# Patient Record
Sex: Female | Born: 1980 | Race: White | Hispanic: No | Marital: Married | Smoking: Never smoker
Health system: Southern US, Community
[De-identification: ages and names within clinical notes are randomized; demographics above are authoritative.]

## PROBLEM LIST (undated history)

## (undated) DIAGNOSIS — F32A Depression, unspecified: Secondary | ICD-10-CM

## (undated) DIAGNOSIS — F419 Anxiety disorder, unspecified: Secondary | ICD-10-CM

## (undated) DIAGNOSIS — F329 Major depressive disorder, single episode, unspecified: Secondary | ICD-10-CM

## (undated) DIAGNOSIS — B009 Herpesviral infection, unspecified: Secondary | ICD-10-CM

## (undated) DIAGNOSIS — R7989 Other specified abnormal findings of blood chemistry: Secondary | ICD-10-CM

## (undated) DIAGNOSIS — E229 Hyperfunction of pituitary gland, unspecified: Secondary | ICD-10-CM

## (undated) DIAGNOSIS — F429 Obsessive-compulsive disorder, unspecified: Secondary | ICD-10-CM

## (undated) HISTORY — DX: Other specified abnormal findings of blood chemistry: R79.89

## (undated) HISTORY — PX: WISDOM TOOTH EXTRACTION: SHX21

## (undated) HISTORY — DX: Hyperfunction of pituitary gland, unspecified: E22.9

## (undated) HISTORY — DX: Herpesviral infection, unspecified: B00.9

---

## 2002-10-27 ENCOUNTER — Encounter: Payer: Self-pay | Admitting: Emergency Medicine

## 2002-10-27 ENCOUNTER — Emergency Department (HOSPITAL_COMMUNITY): Admission: EM | Admit: 2002-10-27 | Discharge: 2002-10-27 | Payer: Self-pay | Admitting: Emergency Medicine

## 2003-03-25 ENCOUNTER — Emergency Department (HOSPITAL_COMMUNITY): Admission: EM | Admit: 2003-03-25 | Discharge: 2003-03-25 | Payer: Self-pay | Admitting: Emergency Medicine

## 2007-01-13 ENCOUNTER — Other Ambulatory Visit: Admission: RE | Admit: 2007-01-13 | Discharge: 2007-01-13 | Payer: Self-pay | Admitting: Gynecology

## 2007-10-01 ENCOUNTER — Ambulatory Visit: Payer: Self-pay | Admitting: Gynecology

## 2008-01-21 ENCOUNTER — Encounter: Payer: Self-pay | Admitting: Gynecology

## 2008-01-21 ENCOUNTER — Other Ambulatory Visit: Admission: RE | Admit: 2008-01-21 | Discharge: 2008-01-21 | Payer: Self-pay | Admitting: Gynecology

## 2008-01-21 ENCOUNTER — Ambulatory Visit: Payer: Self-pay | Admitting: Gynecology

## 2008-03-31 ENCOUNTER — Ambulatory Visit: Payer: Self-pay | Admitting: Gynecology

## 2008-07-14 ENCOUNTER — Ambulatory Visit: Payer: Self-pay | Admitting: Gynecology

## 2008-12-22 ENCOUNTER — Ambulatory Visit: Payer: Self-pay | Admitting: Gynecology

## 2009-03-11 ENCOUNTER — Ambulatory Visit: Payer: Self-pay | Admitting: Gynecology

## 2009-03-11 ENCOUNTER — Other Ambulatory Visit: Admission: RE | Admit: 2009-03-11 | Discharge: 2009-03-11 | Payer: Self-pay | Admitting: Gynecology

## 2009-05-11 ENCOUNTER — Ambulatory Visit: Payer: Self-pay | Admitting: Gynecology

## 2009-07-04 ENCOUNTER — Ambulatory Visit: Payer: Self-pay | Admitting: Gynecology

## 2010-01-08 HISTORY — PX: OTHER SURGICAL HISTORY: SHX169

## 2010-02-20 ENCOUNTER — Ambulatory Visit (INDEPENDENT_AMBULATORY_CARE_PROVIDER_SITE_OTHER): Payer: BC Managed Care – PPO | Admitting: Gynecology

## 2010-02-20 DIAGNOSIS — B3731 Acute candidiasis of vulva and vagina: Secondary | ICD-10-CM

## 2010-02-20 DIAGNOSIS — N9089 Other specified noninflammatory disorders of vulva and perineum: Secondary | ICD-10-CM

## 2010-02-20 DIAGNOSIS — Z113 Encounter for screening for infections with a predominantly sexual mode of transmission: Secondary | ICD-10-CM

## 2010-02-20 DIAGNOSIS — B373 Candidiasis of vulva and vagina: Secondary | ICD-10-CM

## 2010-02-20 DIAGNOSIS — N898 Other specified noninflammatory disorders of vagina: Secondary | ICD-10-CM

## 2010-03-13 ENCOUNTER — Ambulatory Visit (INDEPENDENT_AMBULATORY_CARE_PROVIDER_SITE_OTHER): Payer: BC Managed Care – PPO | Admitting: Gynecology

## 2010-03-13 DIAGNOSIS — A6004 Herpesviral vulvovaginitis: Secondary | ICD-10-CM

## 2010-05-03 ENCOUNTER — Other Ambulatory Visit (HOSPITAL_COMMUNITY)
Admission: RE | Admit: 2010-05-03 | Discharge: 2010-05-03 | Disposition: A | Discharge status: Home / Self Care | Payer: BC Managed Care – PPO | Source: Ambulatory Visit | Attending: Gynecology | Admitting: Gynecology

## 2010-05-03 ENCOUNTER — Encounter (INDEPENDENT_AMBULATORY_CARE_PROVIDER_SITE_OTHER): Payer: BC Managed Care – PPO | Admitting: Gynecology

## 2010-05-03 ENCOUNTER — Other Ambulatory Visit: Payer: Self-pay | Admitting: Gynecology

## 2010-05-03 DIAGNOSIS — Z124 Encounter for screening for malignant neoplasm of cervix: Secondary | ICD-10-CM | POA: Insufficient documentation

## 2010-05-03 DIAGNOSIS — Z01419 Encounter for gynecological examination (general) (routine) without abnormal findings: Secondary | ICD-10-CM

## 2010-05-03 DIAGNOSIS — R823 Hemoglobinuria: Secondary | ICD-10-CM

## 2010-05-03 DIAGNOSIS — Z13220 Encounter for screening for lipoid disorders: Secondary | ICD-10-CM

## 2010-05-27 ENCOUNTER — Emergency Department (HOSPITAL_COMMUNITY): Payer: BC Managed Care – PPO

## 2010-05-27 ENCOUNTER — Inpatient Hospital Stay (HOSPITAL_COMMUNITY)
Admission: EM | Admit: 2010-05-27 | Discharge: 2010-05-30 | DRG: 559 | Disposition: A | Discharge status: Home / Self Care | Payer: BC Managed Care – PPO | Attending: General Surgery | Admitting: General Surgery

## 2010-05-27 DIAGNOSIS — S52209A Unspecified fracture of shaft of unspecified ulna, initial encounter for closed fracture: Principal | ICD-10-CM | POA: Diagnosis present

## 2010-05-27 DIAGNOSIS — S36113A Laceration of liver, unspecified degree, initial encounter: Secondary | ICD-10-CM | POA: Diagnosis present

## 2010-05-27 LAB — DIFFERENTIAL
Basophils Relative: 0 % (ref 0–1)
Eosinophils Absolute: 0.1 10*3/uL (ref 0.0–0.7)
Monocytes Absolute: 0.6 10*3/uL (ref 0.1–1.0)
Monocytes Relative: 4 % (ref 3–12)
Neutro Abs: 10.7 10*3/uL — ABNORMAL HIGH (ref 1.7–7.7)

## 2010-05-27 LAB — COMPREHENSIVE METABOLIC PANEL
ALT: 318 U/L — ABNORMAL HIGH (ref 0–35)
AST: 315 U/L — ABNORMAL HIGH (ref 0–37)
Albumin: 3.5 g/dL (ref 3.5–5.2)
Alkaline Phosphatase: 42 U/L (ref 39–117)
GFR calc Af Amer: >60 mL/min (ref >60)
Glucose, Bld: 122 mg/dL — ABNORMAL HIGH (ref 70–99)
Potassium: 3.7 mEq/L (ref 3.5–5.1)
Sodium: 136 mEq/L (ref 135–145)
Total Protein: 6.3 g/dL (ref 6.0–8.3)

## 2010-05-27 LAB — CBC
Hemoglobin: 13.1 g/dL (ref 12.0–15.0)
MCH: 31.9 pg (ref 26.0–34.0)
MCH: 31.3 pg (ref 26.0–34.0)
MCHC: 35.2 g/dL (ref 30.0–36.0)
MCHC: 34.3 g/dL (ref 30.0–36.0)
RDW: 11.8 % (ref 11.5–15.5)

## 2010-05-27 LAB — URINE MICROSCOPIC-ADD ON

## 2010-05-27 LAB — URINALYSIS, ROUTINE W REFLEX MICROSCOPIC
Glucose, UA: NEGATIVE mg/dL
Ketones, ur: NEGATIVE mg/dL
pH: 6.5 (ref 5.0–8.0)

## 2010-05-27 LAB — ABO/RH: ABO/RH(D): A POS

## 2010-05-27 LAB — TYPE AND SCREEN: Antibody Screen: NEGATIVE

## 2010-05-27 LAB — POCT PREGNANCY, URINE: Preg Test, Ur: NEGATIVE

## 2010-05-27 LAB — MRSA PCR SCREENING: MRSA by PCR: NEGATIVE

## 2010-05-27 MED ORDER — IOHEXOL 300 MG/ML  SOLN: 100.0000 mL | Freq: Once | INTRAMUSCULAR | Status: AC | PRN

## 2010-05-28 ENCOUNTER — Inpatient Hospital Stay (HOSPITAL_COMMUNITY): Payer: BC Managed Care – PPO

## 2010-05-28 LAB — CBC
MCHC: 34.5 g/dL (ref 30.0–36.0)
MCV: 91.4 fL (ref 78.0–100.0)
Platelets: 168 10*3/uL (ref 150–400)
RDW: 11.8 % (ref 11.5–15.5)
WBC: 8.6 10*3/uL (ref 4.0–10.5)

## 2010-05-28 LAB — PROTIME-INR: INR: 1.05 (ref 0.00–1.49)

## 2010-05-28 LAB — BASIC METABOLIC PANEL
BUN: 7 mg/dL (ref 6–23)
CO2: 25 mEq/L (ref 19–32)
Calcium: 8.5 mg/dL (ref 8.4–10.5)
Creatinine, Ser: <0.47 mg/dL (ref 0.4–1.2)

## 2010-05-29 LAB — DIFFERENTIAL
Basophils Absolute: 0 10*3/uL (ref 0.0–0.1)
Basophils Relative: 0 % (ref 0–1)
Eosinophils Absolute: 0.1 10*3/uL (ref 0.0–0.7)
Monocytes Absolute: 0.9 10*3/uL (ref 0.1–1.0)
Monocytes Relative: 10 % (ref 3–12)
Neutro Abs: 6.3 10*3/uL (ref 1.7–7.7)

## 2010-05-29 LAB — CBC
Hemoglobin: 12.5 g/dL (ref 12.0–15.0)
MCH: 32.2 pg (ref 26.0–34.0)
MCHC: 35 g/dL (ref 30.0–36.0)
Platelets: 163 10*3/uL (ref 150–400)
RDW: 11.7 % (ref 11.5–15.5)

## 2010-05-29 LAB — BASIC METABOLIC PANEL
CO2: 26 mEq/L (ref 19–32)
Calcium: 8.6 mg/dL (ref 8.4–10.5)
Creatinine, Ser: <0.47 mg/dL (ref 0.4–1.2)
Sodium: 136 mEq/L (ref 135–145)

## 2010-05-30 LAB — CBC
Hemoglobin: 13.1 g/dL (ref 12.0–15.0)
MCH: 31.1 pg (ref 26.0–34.0)
MCV: 92.2 fL (ref 78.0–100.0)
Platelets: 197 10*3/uL (ref 150–400)
RBC: 4.21 MIL/uL (ref 3.87–5.11)
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
## 2011-03-21 ENCOUNTER — Ambulatory Visit (INDEPENDENT_AMBULATORY_CARE_PROVIDER_SITE_OTHER): Payer: BC Managed Care – PPO | Admitting: Family Medicine

## 2011-03-21 VITALS — BP 100/62 | HR 73 | Temp 98.3°F | Resp 16 | Ht 69.0 in | Wt 134.6 lb

## 2011-03-21 DIAGNOSIS — J069 Acute upper respiratory infection, unspecified: Secondary | ICD-10-CM

## 2011-03-21 DIAGNOSIS — H9209 Otalgia, unspecified ear: Secondary | ICD-10-CM
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
## 2011-04-30 ENCOUNTER — Other Ambulatory Visit: Payer: Self-pay | Admitting: Gynecology

## 2011-05-11 ENCOUNTER — Encounter: Payer: Self-pay | Admitting: Gynecology

## 2011-05-11 DIAGNOSIS — A64 Unspecified sexually transmitted disease: Secondary | ICD-10-CM | POA: Insufficient documentation

## 2011-05-11 DIAGNOSIS — B009 Herpesviral infection, unspecified: Secondary | ICD-10-CM | POA: Insufficient documentation

## 2011-05-15 ENCOUNTER — Encounter: Payer: BC Managed Care – PPO | Admitting: Gynecology

## 2011-05-16 ENCOUNTER — Encounter: Payer: Self-pay | Admitting: Gynecology

## 2011-05-16 ENCOUNTER — Ambulatory Visit (INDEPENDENT_AMBULATORY_CARE_PROVIDER_SITE_OTHER): Payer: BC Managed Care – PPO | Admitting: Gynecology

## 2011-05-16 VITALS — BP 102/60 | Ht 69.0 in | Wt 139.0 lb

## 2011-05-16 DIAGNOSIS — Z01419 Encounter for gynecological examination (general) (routine) without abnormal findings: Secondary | ICD-10-CM

## 2011-05-16 DIAGNOSIS — N643 Galactorrhea not associated with childbirth: Secondary | ICD-10-CM

## 2011-05-16 DIAGNOSIS — Z131 Encounter for screening for diabetes mellitus: Secondary | ICD-10-CM

## 2011-05-16 DIAGNOSIS — E229 Hyperfunction of pituitary gland, unspecified: Secondary | ICD-10-CM

## 2011-05-16 DIAGNOSIS — L729 Follicular cyst of the skin and subcutaneous tissue, unspecified: Secondary | ICD-10-CM

## 2011-05-16 DIAGNOSIS — E221 Hyperprolactinemia: Secondary | ICD-10-CM

## 2011-05-16 DIAGNOSIS — L723 Sebaceous cyst: Secondary | ICD-10-CM

## 2011-05-16 LAB — CBC WITH DIFFERENTIAL/PLATELET
Basophils Absolute: 0 10*3/uL (ref 0.0–0.1)
Basophils Relative: 0 % (ref 0–1)
Eosinophils Relative: 1 % (ref 0–5)
HCT: 40.9 % (ref 36.0–46.0)
MCHC: 34 g/dL (ref 30.0–36.0)
MCV: 94 fL (ref 78.0–100.0)
Monocytes Absolute: 0.4 10*3/uL (ref 0.1–1.0)
RDW: 11.6 % (ref 11.5–15.5)

## 2011-05-16 MED ORDER — NORETHINDRONE ACET-ETHINYL EST 1-20 MG-MCG PO TABS: 1.0000 | ORAL_TABLET | Freq: Every day | ORAL | Status: DC

## 2011-05-16 NOTE — Patient Instructions (Signed)
Follow up in one year for her annual gynecologic exam. 

## 2011-05-16 NOTE — Progress Notes (Signed)
Melanie Khan 1981/01/05 [id number]        31 y.o.  for annual exam.  Several issues below.  Past medical history,surgical history, medications, allergies, family history and social history were all reviewed and documented in the EPIC chart. ROS:  Was performed and pertinent positives and negatives are included in the history.  Exam: Melanie Khan chaperone present Filed Vitals:   05/16/11 1426  BP: 102/60   General appearance  Normal Skin grossly normal Head/Neck normal with no cervical or supraclavicular adenopathy thyroid normal Lungs  clear Cardiac RR, without RMG Abdominal  soft, nontender, without masses, organomegaly or hernia Breasts  examined lying and sitting without masses, retractions or axillary adenopathy.  Milky galactorrhea left nipple none on the right.  Small mobile cutaneous nodule mid anterior axillary line on the left. Pelvic  Ext/BUS/vagina  normal   Cervix  normal   Uterus  Is inverted, normal size, shape and contour, midline and mobile nontender   Adnexa  Without masses or tenderness    Anus and perineum  normal      Assessment/Plan:  31 y.o. female for annual exam.    1. Galactorrhea. Patient has a history of mild hyperprolactinemia last value 28 2010. She has intermittent milky galactorrhea bilaterally. I couldn't elicit galactorrhea on the left. We'll recheck prolactin now. Otherwise we'll continue to monitor. 2. Cutaneous nodule left anterior mid axillary line. Has been present since childhood. Has remained unchanged to my exam for several years. We'll continue to monitor as it does not bother her. 3. Pap smear. No Pap smear was done today. Her last Pap smear was 2012. She has no history of abnormal Pap smears with multiple normal records in her chart. I reviewed current screening guidelines we'll plan every 3-5 your Pap smears. 4. Birth control. She is on Loestrin 120 equivalents doing well and I refilled her times a year. The risks of birth control pills to  include stroke heart attack DVT thrombosis was reviewed and accepted. Alternatives to include progesterone only such as Depo-Provera Implanon progesterone only pill as well as IUD were reviewed. 5. STD screening. She does have a history of positive chlamydia 2011. She's had several negative screens since then. I offered rescreening now she declined and did not feel that she is at risk for this. 6. Health maintenance. Baseline CBC and glucose were ordered. She has several normal lipid profiles in her chart the last in 2011 and I did not repeat it today.    Dara Lords MD, 2:54 PM 05/16/2011

## 2011-07-08 ENCOUNTER — Ambulatory Visit (INDEPENDENT_AMBULATORY_CARE_PROVIDER_SITE_OTHER): Payer: BC Managed Care – PPO | Admitting: Internal Medicine

## 2011-07-08 VITALS — BP 97/49 | HR 57 | Temp 98.3°F | Resp 16 | Ht 69.0 in | Wt 137.8 lb

## 2011-07-08 DIAGNOSIS — L089 Local infection of the skin and subcutaneous tissue, unspecified: Secondary | ICD-10-CM

## 2011-07-08 DIAGNOSIS — S51809A Unspecified open wound of unspecified forearm, initial encounter: Secondary | ICD-10-CM

## 2011-07-08 MED ORDER — AMOXICILLIN-POT CLAVULANATE 875-125 MG PO TABS: 1.0000 | ORAL_TABLET | Freq: Two times a day (BID) | ORAL | Status: AC
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
## 2011-07-09 ENCOUNTER — Telehealth: Payer: Self-pay

## 2011-07-09 ENCOUNTER — Ambulatory Visit (INDEPENDENT_AMBULATORY_CARE_PROVIDER_SITE_OTHER): Payer: BC Managed Care – PPO | Admitting: Internal Medicine

## 2011-07-09 VITALS — BP 116/77 | HR 58 | Temp 98.4°F | Resp 18 | Ht 68.0 in | Wt 140.8 lb

## 2011-07-09 DIAGNOSIS — L03113 Cellulitis of right upper limb: Secondary | ICD-10-CM

## 2011-07-09 DIAGNOSIS — IMO0002 Reserved for concepts with insufficient information to code with codable children: Secondary | ICD-10-CM

## 2011-07-09 MED ORDER — DOXYCYCLINE HYCLATE 100 MG PO TABS: 100.0000 mg | ORAL_TABLET | Freq: Two times a day (BID) | ORAL | Status: AC

## 2011-07-09 NOTE — Telephone Encounter (Signed)
PT STATES SHE WAS SEEN FOR A CAT BITE AND EVEN THOUGH SHE WAS PUT ON ANTIBIOTICS HER HAND IS STILL RED AND SWOLLEN, DIDN'T KNOW WHAT TO LOOK FOR AND IF THAT WAS NORMAL AND HOW MANY DAYS DO SHE NEED TO GIVE IT TO KNOW IT'S GETTING BETTER PLEASE CALL [phone]

## 2011-07-09 NOTE — Telephone Encounter (Signed)
Patient advised to return to clinic for recheck. Patient states she would like to give it another 24 hours.  Advised her if worse come in sooner.  Patient states understanding.

## 2011-07-09 NOTE — Telephone Encounter (Signed)
Left message on machine to return our call. 
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
## 2011-07-12 LAB — WOUND CULTURE: Organism ID, Bacteria: NO GROWTH

## 2011-10-18 ENCOUNTER — Ambulatory Visit (INDEPENDENT_AMBULATORY_CARE_PROVIDER_SITE_OTHER): Payer: BC Managed Care – PPO | Admitting: Family Medicine

## 2011-10-18 VITALS — BP 122/74 | HR 58 | Temp 98.0°F | Resp 17 | Ht 69.0 in | Wt 135.0 lb

## 2011-10-18 DIAGNOSIS — R21 Rash and other nonspecific skin eruption: Secondary | ICD-10-CM

## 2011-10-18 MED ORDER — DOXYCYCLINE HYCLATE 100 MG PO TABS: 100.0000 mg | ORAL_TABLET | Freq: Two times a day (BID) | ORAL | Status: DC

## 2011-10-18 MED ORDER — VALACYCLOVIR HCL 500 MG PO TABS: 500.0000 mg | ORAL_TABLET | Freq: Two times a day (BID) | ORAL | Status: DC

## 2011-10-18 NOTE — Patient Instructions (Addendum)
Start on your valtrex- you have some extra which you can take if you have this problem again. If the valtrex does not seem to be clearing up the problem start on the doxycycline (an antibiotic).

## 2011-10-18 NOTE — Progress Notes (Signed)
[facility] [address], [place] [address] [phone]  Date:  10/18/2011   Name:  Melanie Khan   DOB:  1980/02/25   MRN:  [id number]  PCP:  Sheila Oats, MD    Chief Complaint: Rash   History of Present Illness:  Melanie Khan is a 31 y.o. very pleasant female patient who presents with the following:  About 6 years ago she would get some bumps on her lower body- one abscessed and it turned out to be MRSA.  She had an I and D and recovered.      She has her menses currently.  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  It occurred on her labia.  She is SA with one partner who is also positive for HSV so she is not on any suppressive therapy.  No other vaginal or urinary symptoms   Patient Active Problem List  Diagnosis  . HSV-2 infection  . Elevated prolactin level    Past Medical History  Diagnosis Date  . HSV-2 infection   . Elevated prolactin level     Past Surgical History  Procedure Date  . Right arm fracture 2012    History  Substance Use Topics  . Smoking status: Never Smoker   . Smokeless tobacco: Never Used  . Alcohol Use: Yes     once a week    Family History  Problem Relation Age of Onset  . Hypertension Paternal Grandfather   . Stroke Paternal Aunt     No Known Allergies  Medication list has been reviewed and updated.  Current Outpatient Prescriptions on File Prior to Visit  Medication Sig Dispense Refill  . norethindrone-ethinyl estradiol (GILDESS 1/20) 1-20 MG-MCG tablet Take 1 tablet by mouth daily.  21 tablet  11    Review of Systems: As per HPI- otherwise negative.   Physical Examination: Filed Vitals:   10/18/11 1452  BP: 122/74  Pulse: 58  Temp: 98 F (36.7 C)  Resp: 17   Filed Vitals:   10/18/11 1452  Height: 5\' 9"  (1.753 m)  Weight: 135 lb (61.236 kg)   Body mass index is 19.94 kg/(m^2). Ideal Body Weight: Weight in (lb) to have BMI = 25:  168.9   GEN: WDWN, NAD, Non-toxic, A & O x 3, looks well HEENT: Atraumatic, Normocephalic. Neck supple. No masses, No LAD. Ears and Nose: No external deformity. CV: RRR, No M/G/R. No JVD. No thrill. No extra heart sounds. PULM: CTA B, no wheezes, crackles, rhonchi. No retractions. No resp. distress. No accessory muscle use. ABD: S, NT, ND, +BS. No rebound. No HSM.  On her right inner thigh there is a small group of vesicles typical of HSV.  Very small inguinal nodes on the right EXTR: No c/c/e NEURO Normal gait.  PSYCH: Normally interactive. Conversant. Not depressed or anxious appearing.  Calm demeanor.    Assessment and Plan: 1. Rash of groin  doxycycline (VIBRA-TABS) 100 MG tablet, valACYclovir (VALTREX) 500 MG tablet, DISCONTINUED: valACYclovir (VALTREX) 500 MG tablet   Chales Abrahams appears to be having an HSV outbreak.  Will treat with valcyclovir, and gave extra in care of future outbreaks. She is concerned that she could have a bacterial super- infection, so also gave her doxy that she can sue is the valtrex is not helping.  Discussed suppressive therapy and pregnancy precautions of HSV.  She will let us know if not better soon.  Lamar Blinks, MD

## 2011-11-19 ENCOUNTER — Telehealth: Payer: Self-pay | Admitting: Gynecology

## 2011-11-19 DIAGNOSIS — R21 Rash and other nonspecific skin eruption: Secondary | ICD-10-CM

## 2011-11-19 MED ORDER — VALACYCLOVIR HCL 500 MG PO TABS: 500.0000 mg | ORAL_TABLET | Freq: Two times a day (BID) | ORAL | Status: DC

## 2011-11-19 NOTE — Telephone Encounter (Signed)
Valtrex 500 mg twice daily for 3 days at earliest onset of outbreak. #24 with one refill

## 2011-11-19 NOTE — Telephone Encounter (Signed)
Patient said when she was diagnosed with HSV you told her you could call in RX with refills so she could have it on hand as soon as she felt symptoms.  She would like to get RX so she can have on hand when needed. Is also having sx now.

## 2012-01-01 ENCOUNTER — Ambulatory Visit (INDEPENDENT_AMBULATORY_CARE_PROVIDER_SITE_OTHER): Payer: BC Managed Care – PPO | Admitting: Physician Assistant

## 2012-01-01 VITALS — BP 113/68 | HR 60 | Temp 98.3°F | Resp 16 | Ht 69.0 in | Wt 133.0 lb

## 2012-01-01 DIAGNOSIS — J029 Acute pharyngitis, unspecified: Secondary | ICD-10-CM

## 2012-01-01 DIAGNOSIS — J309 Allergic rhinitis, unspecified: Secondary | ICD-10-CM

## 2012-01-01 DIAGNOSIS — R0982 Postnasal drip: Secondary | ICD-10-CM

## 2012-01-01 DIAGNOSIS — R5381 Other malaise: Secondary | ICD-10-CM

## 2012-01-01 LAB — POCT CBC
MCH, POC: 30.4 pg (ref 27–31.2)
MCV: 97.7 fL — AB (ref 80–97)
MID (cbc): 0.4 (ref 0–0.9)
POC LYMPH PERCENT: 36 %L (ref 10–50)
Platelet Count, POC: 260 10*3/uL (ref 142–424)
RDW, POC: 12.4 %
WBC: 6.1 10*3/uL (ref 4.6–10.2)

## 2012-01-01 LAB — COMPREHENSIVE METABOLIC PANEL
Alkaline Phosphatase: 43 U/L (ref 39–117)
Creat: 0.67 mg/dL (ref 0.50–1.10)
Glucose, Bld: 87 mg/dL (ref 70–99)
Sodium: 142 mEq/L (ref 135–145)
Total Bilirubin: 0.4 mg/dL (ref 0.3–1.2)
Total Protein: 6.7 g/dL (ref 6.0–8.3)

## 2012-01-01 MED ORDER — CEFDINIR 300 MG PO CAPS: 300.0000 mg | ORAL_CAPSULE | Freq: Two times a day (BID) | ORAL | Status: DC

## 2012-01-01 MED ORDER — TRAMADOL HCL 50 MG PO TABS: 50.0000 mg | ORAL_TABLET | Freq: Three times a day (TID) | ORAL | Status: DC | PRN

## 2012-01-01 MED ORDER — FLUTICASONE PROPIONATE 50 MCG/ACT NA SUSP: 2.0000 | Freq: Every day | NASAL | Status: DC

## 2012-01-01 NOTE — Progress Notes (Signed)
Patient ID: Melanie Khan MRN: [id number], DOB: July 07, 1980, 31 y.o. Date of Encounter: 01/01/2012, 10:17 AM  Primary Physician: Default, Provider, MD  Chief Complaint: Sore throat  HPI: 31 y.o. year old female with history below presents sore throat for 10 days. Patient states she was initially sick with URI/influenza like symptoms about three weeks prior and treated with OTC medications eventually causing the symptoms to resolve. She did feel better for a few days; however, her sore throat returned initially on the right side, now on the left side. She does have some associated post nasal drip. No nasal congestion, rhinorrhea, cough, headache, otalgia, or sinus pressure. She has remained afebrile since her original illness three weeks prior. Normal appetite. Some waxing and waning fatigue. Has been pushing fluids. No sick contacts. 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   She also states that when she usually develops an upper respiratory infection they usually linger in her sinuses requiring one or two rounds of antibiotics to clear; however, this is unlike her typical presentation as she does not have the sinus pressure. She does feel like with her usual presentation that she has post nasal drip also, but this time she has a pinpoint sore throat on the left side. She is generally healthy. No tobacco use.    Past Medical History  Diagnosis Date  . HSV-2 infection   . Elevated prolactin level      Home Meds: Prior to Admission medications   Medication Sig Start Date End Date Taking? Authorizing Provider  norethindrone-ethinyl estradiol (GILDESS 1/20) 1-20 MG-MCG tablet Take 1 tablet by mouth daily. 05/16/11  Yes Dara Lords, MD  valACYclovir (VALTREX) 500 MG tablet Take 1 tablet (500 mg total) by mouth 2 (two) times daily. Take for 3 days as needed for outbreaks 11/19/11  Yes Dara Lords, MD    Allergies: No Known Allergies  History   Social History  .  Marital Status: Single    Spouse Name: N/A    Number of Children: N/A  . Years of Education: N/A   Occupational History  . Not on file.   Social History Main Topics  . Smoking status: Never Smoker   . Smokeless tobacco: Never Used  . Alcohol Use: Yes     Comment: once a week  . Drug Use: No  . Sexually Active: Yes    Birth Control/ Protection: Pill   Other Topics Concern  . Not on file   Social History Narrative  . No narrative on file     Review of Systems: Constitutional: Positive for fever and fatigue. Negative for chills, night sweats, or weight changes.  HEENT: Positive for sore throat and post nasal drip. Negative for vision changes, hearing loss, congestion, rhinorrhea, epistaxis, or sinus pressure. Cardiovascular: negative for chest pain or palpitations Respiratory: negative for shortness of breath, or cough Abdominal: negative for abdominal pain, nausea, vomiting, or diarrhea Dermatological: negative for rash Neurologic: negative for headache   Physical Exam: Blood pressure 113/68, pulse 60, temperature 98.3 F (36.8 C), resp. rate 16, height 5\' 9"  (1.753 m), weight 133 lb (60.328 kg), last menstrual period 12/11/2011., Body mass index is 19.64 kg/(m^2). General: Well developed, well nourished, in no acute distress. Head: Normocephalic, atraumatic, eyes without discharge, sclera non-icteric, nares are mildly congested. Bilateral auditory canals clear, TM's are without perforation, pearly grey and translucent with reflective cone of light bilaterally. Oral cavity moist, posterior pharynx mildly erythematous and with post nasal  drip. No exudate, swelling, or peritonsillar abscess. Uvula midline.  Neck: Supple. No thyromegaly. Full ROM. No lymphadenopathy. Lymph nodes: less than 2 cm AC bilaterally.  Lungs: Clear bilaterally to auscultation without wheezes, rales, or rhonchi. Breathing is unlabored. Heart: RRR with S1 S2. No murmurs, rubs, or gallops  appreciated. Abdomen: Soft, non-tender, non-distended with normoactive bowel sounds. No hepatosplenomegaly. No rebound/guarding. No obvious abdominal masses. Negative McBurney's. Msk:  Strength and tone normal for age. Extremities/Skin: Warm and dry. No clubbing or cyanosis. No edema. No rashes or suspicious lesions. Neuro: Alert and oriented X 3. Moves all extremities spontaneously. Gait is normal. CNII-XII grossly in tact. Psych:  Responds to questions appropriately with a normal affect.   Labs: Results for orders placed in visit on 01/01/12  POCT CBC      Component Value Range   WBC 6.1  4.6 - 10.2 K/uL   Lymph, poc 2.2  0.6 - 3.4   POC LYMPH PERCENT 36.0  10 - 50 %L   MID (cbc) 0.4  0 - 0.9   POC MID % 5.8  0 - 12 %M   POC Granulocyte 3.6  2 - 6.9   Granulocyte percent 58.2  37 - 80 %G   RBC 4.48  4.04 - 5.48 M/uL   Hemoglobin 13.6  12.2 - 16.2 g/dL   HCT, POC 21.3  08.6 - 47.9 %   MCV 97.7 (*) 80 - 97 fL   MCH, POC 30.4  27 - 31.2 pg   MCHC 31.1 (*) 31.8 - 35.4 g/dL   RDW, POC 57.8     Platelet Count, POC 260  142 - 424 K/uL   MPV 9.5  0 - 99.8 fL  POCT RAPID STREP A (OFFICE)      Component Value Range   Rapid Strep A Screen Negative  Negative   Throat culture, CMP, EBV titer, and CMV titer all pending.  ASSESSMENT AND PLAN:  31 y.o. year old female with pharyngitis, post nasal drip, and fatigue. -Omnicef 300 mg 1 po bid #20 no RF  -Ultram 50 mg 1 po tid prn pain #30 no RF, SED -Flonase 2 sprays each nare daily #1 RF 6 -May call for Atrovent nasal if needed -Likely some underlying allergic rhinitis causing some of her prolonged mucus production -Await labs   Signed, Eula Listen, PA-C 01/01/2012 10:17 AM

## 2012-01-02 LAB — EPSTEIN-BARR VIRUS VCA ANTIBODY PANEL
EBV EA IgG: 8.6 U/mL (ref <9.0)
EBV NA IgG: >600 U/mL — ABNORMAL HIGH (ref <18.0)

## 2012-01-03 LAB — CYTOMEGALOVIRUS ANTIBODY, IGG: Cytomegalovirus Ab-IgG: 0.14 (ref <0.90)

## 2012-01-04 LAB — CULTURE, GROUP A STREP

## 2012-04-13 ENCOUNTER — Other Ambulatory Visit: Payer: Self-pay | Admitting: Gynecology

## 2012-07-13 ENCOUNTER — Other Ambulatory Visit: Payer: Self-pay | Admitting: Gynecology

## 2012-07-14 NOTE — Telephone Encounter (Signed)
Pt will be called to schedule annual exam KW

## 2012-08-18 ENCOUNTER — Other Ambulatory Visit: Payer: Self-pay | Admitting: Gynecology

## 2012-08-21 ENCOUNTER — Telehealth: Payer: Self-pay

## 2012-08-21 NOTE — Telephone Encounter (Signed)
Patient called Melanie Khan because her OCP refill request was denied. Her last CE was 05/16/11 and we have left a message with each refill that past due for yearly exam and to please call to schedule.  She first told Melanie Khan she wanted [place] to call it in and she would make appt later. She said she is busy with wedding and honeymoon plans.  Melanie Khan did get her to schedule CE but it is Oct 7 because that was your first afternoon appointment and she needed afternoon.  Okay to refill her OCP's until then?

## 2012-08-22 ENCOUNTER — Other Ambulatory Visit: Payer: Self-pay | Admitting: Gynecology

## 2012-08-22 MED ORDER — NORETHINDRONE ACET-ETHINYL EST 1-20 MG-MCG PO TABS: ORAL_TABLET | ORAL | Status: DC

## 2012-08-22 NOTE — Telephone Encounter (Signed)
OK through then, BUT tell patient not beyond that date regardless of the reason

## 2012-08-22 NOTE — Telephone Encounter (Signed)
Patient advised. Rx in.

## 2012-09-26 ENCOUNTER — Other Ambulatory Visit: Payer: Self-pay | Admitting: Obstetrics and Gynecology

## 2012-09-26 ENCOUNTER — Other Ambulatory Visit (HOSPITAL_COMMUNITY)
Admission: RE | Admit: 2012-09-26 | Discharge: 2012-09-26 | Disposition: A | Discharge status: Home / Self Care | Payer: BC Managed Care – PPO | Source: Ambulatory Visit | Attending: Obstetrics and Gynecology | Admitting: Obstetrics and Gynecology

## 2012-09-26 DIAGNOSIS — Z1151 Encounter for screening for human papillomavirus (HPV): Secondary | ICD-10-CM | POA: Insufficient documentation

## 2012-09-26 DIAGNOSIS — Z01419 Encounter for gynecological examination (general) (routine) without abnormal findings: Secondary | ICD-10-CM | POA: Insufficient documentation

## 2012-10-11 ENCOUNTER — Other Ambulatory Visit: Payer: Self-pay | Admitting: Gynecology

## 2012-10-14 ENCOUNTER — Encounter: Payer: BC Managed Care – PPO | Admitting: Gynecology

## 2013-09-17 ENCOUNTER — Telehealth: Payer: Self-pay

## 2013-09-17 NOTE — Telephone Encounter (Signed)
Patient has not seen you since 2013 but had a couple of questions.  She said you had told her that her Prolactin was elevated. She is going to try to conceive now and wondered if it was high enough on the last reading (29.6) that it could interfere with conception and possibly be an issue.

## 2013-09-17 NOTE — Telephone Encounter (Signed)
It is difficult to offer any opinion based on interactions over 32 years old. Would recommend office visit and remeasuring her prolactin level before I can make any assessments

## 2013-09-18 NOTE — Telephone Encounter (Signed)
Patient advised.

## 2013-11-18 ENCOUNTER — Other Ambulatory Visit (HOSPITAL_COMMUNITY)
Admission: RE | Admit: 2013-11-18 | Discharge: 2013-11-18 | Disposition: A | Discharge status: Home / Self Care | Payer: BC Managed Care – PPO | Source: Ambulatory Visit | Attending: Obstetrics and Gynecology | Admitting: Obstetrics and Gynecology

## 2013-11-18 ENCOUNTER — Other Ambulatory Visit: Payer: Self-pay | Admitting: Obstetrics and Gynecology

## 2013-11-18 DIAGNOSIS — Z01419 Encounter for gynecological examination (general) (routine) without abnormal findings: Secondary | ICD-10-CM | POA: Insufficient documentation

## 2013-11-19 LAB — CYTOLOGY - PAP

## 2014-02-12 ENCOUNTER — Other Ambulatory Visit (HOSPITAL_COMMUNITY): Payer: Self-pay | Admitting: Obstetrics & Gynecology

## 2014-02-12 DIAGNOSIS — N979 Female infertility, unspecified: Secondary | ICD-10-CM

## 2014-02-19 ENCOUNTER — Ambulatory Visit (HOSPITAL_COMMUNITY)
Admission: RE | Admit: 2014-02-19 | Discharge: 2014-02-19 | Disposition: A | Discharge status: Home / Self Care | Payer: BC Managed Care – PPO | Source: Ambulatory Visit | Attending: Obstetrics & Gynecology | Admitting: Obstetrics & Gynecology

## 2014-02-19 DIAGNOSIS — N979 Female infertility, unspecified: Secondary | ICD-10-CM | POA: Diagnosis not present

## 2014-02-19 MED ORDER — IOHEXOL 300 MG/ML  SOLN
30.0000 mL | Freq: Once | INTRAMUSCULAR | Status: AC | PRN
  Administered 2014-02-19: 30 mL

## 2014-02-23 ENCOUNTER — Other Ambulatory Visit: Payer: Self-pay | Admitting: Internal Medicine

## 2014-02-23 DIAGNOSIS — R7989 Other specified abnormal findings of blood chemistry: Secondary | ICD-10-CM

## 2014-02-23 DIAGNOSIS — E229 Hyperfunction of pituitary gland, unspecified: Principal | ICD-10-CM

## 2014-03-02 ENCOUNTER — Other Ambulatory Visit: Payer: BC Managed Care – PPO

## 2014-03-17 ENCOUNTER — Ambulatory Visit
Admission: RE | Admit: 2014-03-17 | Discharge: 2014-03-17 | Disposition: A | Discharge status: Home / Self Care | Payer: BC Managed Care – PPO | Source: Ambulatory Visit | Attending: Internal Medicine | Admitting: Internal Medicine

## 2014-03-17 DIAGNOSIS — R7989 Other specified abnormal findings of blood chemistry: Secondary | ICD-10-CM

## 2014-03-17 DIAGNOSIS — E229 Hyperfunction of pituitary gland, unspecified: Principal | ICD-10-CM

## 2014-03-17 MED ORDER — GADOBENATE DIMEGLUMINE 529 MG/ML IV SOLN
6.0000 mL | Freq: Once | INTRAVENOUS | Status: AC | PRN
  Administered 2014-03-17: 6 mL via INTRAVENOUS

## 2014-03-18 ENCOUNTER — Other Ambulatory Visit: Payer: BC Managed Care – PPO

## 2014-12-20 LAB — OB RESULTS CONSOLE ABO/RH: RH Type: POSITIVE

## 2014-12-20 LAB — OB RESULTS CONSOLE HEPATITIS B SURFACE ANTIGEN: Hepatitis B Surface Ag: NEGATIVE

## 2014-12-20 LAB — OB RESULTS CONSOLE RPR: RPR: NONREACTIVE

## 2014-12-20 LAB — OB RESULTS CONSOLE HIV ANTIBODY (ROUTINE TESTING): HIV: NONREACTIVE

## 2014-12-20 LAB — OB RESULTS CONSOLE RUBELLA ANTIBODY, IGM: Rubella: IMMUNE

## 2014-12-22 ENCOUNTER — Inpatient Hospital Stay (HOSPITAL_COMMUNITY)
Admission: AD | Admit: 2014-12-22 | Discharge: 2014-12-22 | Disposition: A | Discharge status: Home / Self Care | Payer: BC Managed Care – PPO | Attending: Obstetrics & Gynecology | Admitting: Obstetrics & Gynecology

## 2014-12-22 ENCOUNTER — Telehealth: Payer: Self-pay

## 2014-12-22 NOTE — MAU Note (Signed)
Gaye in admissions called back here to let us know that Melanie Khan did not want to stay.  She did not want to be charged for an ED visit and said she would wait until her appointment on Friday.  She was informed we would be happy to see her but she did not want to stay.

## 2014-12-22 NOTE — Telephone Encounter (Deleted)
Patient calls back stating she has left MAU prior to being seen.  Patient states she can not afford to be charged for an ER visit and was informed in the office that "going to women[facility] was all the same charge."  Patient informed that provider unsure of patient insurance plan and can not provide any insight regarding this.  Patient encouraged to call office, in am, and discuss with insurance specialist as appropriate.  In the meantime patient encouraged to hydrate, rest, and attempt tylenol for cramping.  Patient also reporting some UTI symptoms and informed that she would have to schedule appt in am or come back into the MAU before meds can be prescribed. JE, CNM

## 2014-12-22 NOTE — Telephone Encounter (Addendum)
Patient call returned regarding question of "can I go to the hospital."  Upon making contact, [facility].  Patient states she was having cramping yesterday and was instructed to report to MAU, but did not.  Patient states she had "a heavy workload" today and cramping resumed.  Patient states she felt it necessary to be seen since the cramping was the same as yesterday.  Patient informed that she would be seen by MAU provider.  No other questions or concerns. JE, CNM   Patient calls back stating she has left MAU prior to being seen.  Patient states she can not afford to be charged for an ER visit and was informed in the office that "going to women's was all the same charge."  Patient informed that provider unsure of patient insurance plan and can not provide any insight regarding this.  Patient encouraged to call office, in am, and discuss with insurance specialist as appropriate.  In the meantime patient encouraged to hydrate, rest, and attempt tylenol for cramping.  Patient also reporting some UTI symptoms and informed that she would have to schedule appt in am or come back into the MAU before meds can be prescribed. JE, CNM

## 2014-12-24 LAB — OB RESULTS CONSOLE GC/CHLAMYDIA
CHLAMYDIA, DNA PROBE: NEGATIVE
GC PROBE AMP, GENITAL: NEGATIVE

## 2015-01-09 NOTE — L&D Delivery Note (Signed)
Delivery Note At 10:12 PM a viable female was delivered via Vaginal, Spontaneous Delivery (Presentation: Left Occiput Anterior).  APGAR: 9, 9; weight pending Placenta status: Intact, Spontaneous.  Cord: 3 vessels with the following complications: None.   Anesthesia: Epidural  Episiotomy: Right Mediolateral;Median Lacerations:   Suture Repair: 2.0 3.0 vicryl Est. Blood Loss (mL): 650  Mom to postpartum.  Baby to Couplet care / Skin to Skin.  Myna HidalgoZAN, Lashawndra Lampkins, M 07/08/2015, 10:53 PM

## 2015-03-23 ENCOUNTER — Inpatient Hospital Stay (HOSPITAL_COMMUNITY)
Admission: AD | Admit: 2015-03-23 | Payer: BC Managed Care – PPO | Source: Ambulatory Visit | Admitting: Obstetrics & Gynecology

## 2015-06-21 ENCOUNTER — Institutional Professional Consult (permissible substitution): Payer: BC Managed Care – PPO | Admitting: Pediatrics

## 2015-06-24 ENCOUNTER — Telehealth: Payer: Self-pay | Admitting: Certified Nurse Midwife

## 2015-06-24 NOTE — Telephone Encounter (Signed)
Linus OrnMary Ann Sawyer is a 35 yo, G1P0010 at  [redacted] wks ega per pt report.   She called via the answering service reporting spotting when she wiped. The on call CNM returned her phone call, after two previous attemps to reach her, and sope with her.   She reports +FM, no lof, no contractions and some light spotting/blood tinged mucous when she wipes.   Pt was reassured that blood tinged mucious is not uncommon as she gets closer to delivery.  Pt instructed to call/ get evaluated if she has bleeding like a period or reduced fetal movement.  Pt states she understands and provided her gratitude.  RS, CNM

## 2015-06-28 ENCOUNTER — Ambulatory Visit (INDEPENDENT_AMBULATORY_CARE_PROVIDER_SITE_OTHER): Payer: Self-pay | Admitting: Pediatrics

## 2015-06-28 DIAGNOSIS — Z7681 Expectant parent(s) prebirth pediatrician visit: Secondary | ICD-10-CM

## 2015-06-28 DIAGNOSIS — Z3490 Encounter for supervision of normal pregnancy, unspecified, unspecified trimester: Secondary | ICD-10-CM

## 2015-06-28 NOTE — Progress Notes (Signed)
Prenatal counseling for impending newborn done-- Z76.81  

## 2015-06-28 NOTE — Patient Instructions (Signed)
see at delivery

## 2015-07-08 ENCOUNTER — Inpatient Hospital Stay (HOSPITAL_COMMUNITY): Payer: BC Managed Care – PPO | Admitting: Anesthesiology

## 2015-07-08 ENCOUNTER — Encounter (HOSPITAL_COMMUNITY): Payer: Self-pay | Admitting: *Deleted

## 2015-07-08 ENCOUNTER — Inpatient Hospital Stay (HOSPITAL_COMMUNITY)
Admission: AD | Admit: 2015-07-08 | Discharge: 2015-07-10 | DRG: 774 | Disposition: A | Discharge status: Home / Self Care | Payer: BC Managed Care – PPO | Source: Ambulatory Visit | Attending: Obstetrics & Gynecology | Admitting: Obstetrics & Gynecology

## 2015-07-08 DIAGNOSIS — Z8249 Family history of ischemic heart disease and other diseases of the circulatory system: Secondary | ICD-10-CM

## 2015-07-08 DIAGNOSIS — Z823 Family history of stroke: Secondary | ICD-10-CM

## 2015-07-08 DIAGNOSIS — Z3A39 39 weeks gestation of pregnancy: Secondary | ICD-10-CM | POA: Diagnosis not present

## 2015-07-08 DIAGNOSIS — A6000 Herpesviral infection of urogenital system, unspecified: Secondary | ICD-10-CM | POA: Diagnosis present

## 2015-07-08 DIAGNOSIS — O9832 Other infections with a predominantly sexual mode of transmission complicating childbirth: Secondary | ICD-10-CM | POA: Diagnosis present

## 2015-07-08 DIAGNOSIS — Z3490 Encounter for supervision of normal pregnancy, unspecified, unspecified trimester: Secondary | ICD-10-CM

## 2015-07-08 LAB — CBC
HCT: 37.9 % (ref 36.0–46.0)
HEMOGLOBIN: 13.1 g/dL (ref 12.0–15.0)
MCH: 32.3 pg (ref 26.0–34.0)
MCHC: 34.6 g/dL (ref 30.0–36.0)
MCV: 93.3 fL (ref 78.0–100.0)
Platelets: 194 10*3/uL (ref 150–400)
RBC: 4.06 MIL/uL (ref 3.87–5.11)
RDW: 12.8 % (ref 11.5–15.5)
WBC: 12.2 10*3/uL — AB (ref 4.0–10.5)

## 2015-07-08 LAB — OB RESULTS CONSOLE GBS: STREP GROUP B AG: NEGATIVE

## 2015-07-08 LAB — TYPE AND SCREEN
ABO/RH(D): A POS
ANTIBODY SCREEN: NEGATIVE

## 2015-07-08 LAB — ABO/RH: ABO/RH(D): A POS

## 2015-07-08 MED ORDER — LACTATED RINGERS IV SOLN: INTRAVENOUS | Status: DC

## 2015-07-08 MED ORDER — LACTATED RINGERS IV SOLN: 500.0000 mL | Freq: Once | INTRAVENOUS | Status: DC

## 2015-07-08 MED ORDER — PHENYLEPHRINE 40 MCG/ML (10ML) SYRINGE FOR IV PUSH (FOR BLOOD PRESSURE SUPPORT)
80.0000 ug | PREFILLED_SYRINGE | INTRAVENOUS | Status: DC | PRN
  Filled 2015-07-08: qty 5

## 2015-07-08 MED ORDER — ONDANSETRON HCL 4 MG/2ML IJ SOLN: 4.0000 mg | Freq: Four times a day (QID) | INTRAMUSCULAR | Status: DC | PRN

## 2015-07-08 MED ORDER — OXYTOCIN BOLUS FROM INFUSION
500.0000 mL | INTRAVENOUS | Status: DC
  Administered 2015-07-08: 500 mL via INTRAVENOUS

## 2015-07-08 MED ORDER — PHENYLEPHRINE 40 MCG/ML (10ML) SYRINGE FOR IV PUSH (FOR BLOOD PRESSURE SUPPORT)
80.0000 ug | PREFILLED_SYRINGE | INTRAVENOUS | Status: DC | PRN
  Filled 2015-07-08: qty 5
  Filled 2015-07-08: qty 10

## 2015-07-08 MED ORDER — MISOPROSTOL 200 MCG PO TABS
1000.0000 ug | ORAL_TABLET | Freq: Once | ORAL | Status: AC
  Administered 2015-07-09: 1000 ug via RECTAL

## 2015-07-08 MED ORDER — LACTATED RINGERS IV SOLN: 500.0000 mL | INTRAVENOUS | Status: DC | PRN

## 2015-07-08 MED ORDER — SOD CITRATE-CITRIC ACID 500-334 MG/5ML PO SOLN: 30.0000 mL | ORAL | Status: DC | PRN

## 2015-07-08 MED ORDER — MISOPROSTOL 200 MCG PO TABS
ORAL_TABLET | ORAL | Status: AC
  Filled 2015-07-08: qty 5

## 2015-07-08 MED ORDER — MISOPROSTOL 200 MCG PO TABS
ORAL_TABLET | ORAL | Status: AC
  Administered 2015-07-09: 1000 ug via RECTAL
  Filled 2015-07-08: qty 5

## 2015-07-08 MED ORDER — OXYTOCIN 40 UNITS IN LACTATED RINGERS INFUSION - SIMPLE MED: 2.5000 [IU]/h | INTRAVENOUS | Status: DC

## 2015-07-08 MED ORDER — OXYCODONE-ACETAMINOPHEN 5-325 MG PO TABS: 2.0000 | ORAL_TABLET | ORAL | Status: DC | PRN

## 2015-07-08 MED ORDER — OXYCODONE-ACETAMINOPHEN 5-325 MG PO TABS: 1.0000 | ORAL_TABLET | ORAL | Status: DC | PRN

## 2015-07-08 MED ORDER — FLEET ENEMA 7-19 GM/118ML RE ENEM: 1.0000 | ENEMA | RECTAL | Status: DC | PRN

## 2015-07-08 MED ORDER — DIPHENHYDRAMINE HCL 50 MG/ML IJ SOLN: 12.5000 mg | INTRAMUSCULAR | Status: DC | PRN

## 2015-07-08 MED ORDER — LIDOCAINE HCL (PF) 1 % IJ SOLN
30.0000 mL | INTRAMUSCULAR | Status: DC | PRN
  Filled 2015-07-08: qty 30

## 2015-07-08 MED ORDER — OXYTOCIN BOLUS FROM INFUSION: 500.0000 mL | INTRAVENOUS | Status: DC

## 2015-07-08 MED ORDER — EPHEDRINE 5 MG/ML INJ
10.0000 mg | INTRAVENOUS | Status: DC | PRN
  Filled 2015-07-08: qty 2

## 2015-07-08 MED ORDER — LIDOCAINE HCL (PF) 1 % IJ SOLN: 30.0000 mL | INTRAMUSCULAR | Status: DC | PRN

## 2015-07-08 MED ORDER — FENTANYL 2.5 MCG/ML BUPIVACAINE 1/10 % EPIDURAL INFUSION (WH - ANES)
14.0000 mL/h | INTRAMUSCULAR | Status: DC | PRN
Start: 2015-07-08 — End: 2015-07-09
  Administered 2015-07-08: 14 mL/h via EPIDURAL
  Filled 2015-07-08: qty 125

## 2015-07-08 MED ORDER — ACETAMINOPHEN 325 MG PO TABS: 650.0000 mg | ORAL_TABLET | ORAL | Status: DC | PRN

## 2015-07-08 MED ORDER — OXYTOCIN 40 UNITS IN LACTATED RINGERS INFUSION - SIMPLE MED
2.5000 [IU]/h | INTRAVENOUS | Status: DC
  Administered 2015-07-09: 2.5 [IU]/h via INTRAVENOUS
  Filled 2015-07-08 (×2): qty 1000

## 2015-07-08 MED ORDER — LIDOCAINE HCL (PF) 1 % IJ SOLN
INTRAMUSCULAR | Status: DC | PRN
  Administered 2015-07-08 (×2): 6 mL

## 2015-07-08 NOTE — Progress Notes (Signed)
Patient reports Herpes, and states not having outbreak. Patient requested Ozan examine. Called Ozan and was advised when she finishes in the office she'll come by and do the exam and AROM patient. Ozan also advised when she checked patient yesterday, there was no outbreak.

## 2015-07-08 NOTE — H&P (Signed)
Melanie Khan is a 35 y.o. female, G2P0010 at 39w6 weeks, presenting for painful contractions since last night.  No LOF, No VB, +FM  ROS: no headache, no blurry vision, no RUQ pain.  Pregnancy c/b: 1)HSV2- on valtrex suppression, asymptomatic.   OB History    Gravida Para Term Preterm AB TAB SAB Ectopic Multiple Living   2    1          Past Medical History  Diagnosis Date  . HSV-2 infection   . Elevated prolactin level [facility])    Past Surgical History  Procedure Laterality Date  . Right arm fracture  2012  . Wisdom tooth extraction     Family History: family history includes Hypertension in her paternal grandfather; Stroke in her paternal aunt. Social History:  reports that she has never smoked. She has never used smokeless tobacco. She reports that she drinks alcohol. She reports that she does not use illicit drugs.   Prenatal Transfer Tool  Maternal Diabetes: No Genetic Screening: Normal Maternal Ultrasounds/Referrals: Normal Fetal Ultrasounds or other Referrals:  None Maternal Substance Abuse:  No Significant Maternal Medications:  None Significant Maternal Lab Results: Lab values include: Group B Strep negative  TDAP 05/02/15   No Known Allergies   O: BP 135/80 mmHg  Pulse 76  Temp(Src) 98.4 F (36.9 C) (Oral)  Resp 20  Ht 5\' 9"  (1.753 m)  Wt 86.183 kg (190 lb)  BMI 28.05 kg/m2 Gen: NAD CV: RR Lungs: normal respiratory effort Abd gravid, NT Pelvic: no external or internal lesions noted Ext: no edema, no calf tenderness  FHR: Category I UCs:  q324min SVE: 4-5/70/-2, AROM clear  Prenatal labs: ABO, Rh:  A positive Antibody:  negative Rubella:  immune RPR:   NR HBsAg:   negative HIV:   negative GBS:  negative Pap:  11/2013-negative GC:  negaitve Chlamydia:  negative Genetic screenings:  Tetra negative Glucola:  Elevated 1hr (163), normal 3hr Hgb 12.5 (05/16/15)    Assessment/Plan:  35yo G2P0010 @ 2031w5d who presents for labor -FWB- Cat  I -HSV2- asymptomatic, no lesions on examination -Labor- expectant management, AROM performed -Pain management- pt undecided- IV or epidural upon request  Myna HidalgoJennifer Annelle Behrendt, DO [phone] (pager) [phone] (office)

## 2015-07-08 NOTE — Anesthesia Preprocedure Evaluation (Signed)
Anesthesia Evaluation  Patient identified by MRN, date of birth, ID band Patient awake    Reviewed: Allergy & Precautions, NPO status , Patient's Chart, lab work & pertinent test results  Airway Mallampati: II  TM Distance: >3 FB Neck ROM: Full    Dental no notable dental hx.    Pulmonary neg pulmonary ROS,    Pulmonary exam normal breath sounds clear to auscultation       Cardiovascular negative cardio ROS Normal cardiovascular exam Rhythm:Regular Rate:Normal     Neuro/Psych negative neurological ROS  negative psych ROS   GI/Hepatic negative GI ROS, Neg liver ROS,   Endo/Other  negative endocrine ROS  Renal/GU negative Renal ROS  negative genitourinary   Musculoskeletal negative musculoskeletal ROS (+)   Abdominal   Peds negative pediatric ROS (+)  Hematology negative hematology ROS (+)   Anesthesia Other Findings   Reproductive/Obstetrics negative OB ROS                             Anesthesia Physical Anesthesia Plan  ASA: II  Anesthesia Plan: Epidural   Post-op Pain Management:    Induction: Intravenous  Airway Management Planned: Natural Airway  Additional Equipment:   Intra-op Plan:   Post-operative Plan:   Informed Consent: I have reviewed the patients History and Physical, chart, labs and discussed the procedure including the risks, benefits and alternatives for the proposed anesthesia with the patient or authorized representative who has indicated his/her understanding and acceptance.   Dental advisory given  Plan Discussed with: CRNA  Anesthesia Plan Comments: (Informed consent obtained prior to proceeding including risk of failure, 1% risk of PDPH, risk of minor discomfort and bruising.  Discussed rare but serious complications including epidural abscess, permanent nerve injury, epidural hematoma.  Discussed alternatives to epidural analgesia and patient desires  to proceed.  Timeout performed pre-procedure verifying patient name, procedure, and platelet count.  Patient tolerated procedure well.)        Anesthesia Quick Evaluation  

## 2015-07-08 NOTE — Progress Notes (Signed)
OB PN:  S: Pt resting comfortably, no acute complaints  O: BP 133/79 mmHg  Pulse 74  Temp(Src) 98.9 F (37.2 C) (Oral)  Resp 18  Ht 5\' 9"  (1.753 m)  Wt 86.183 kg (190 lb)  BMI 28.05 kg/m2  FHT: 125 bpm, moderate variablity, + accels, no decels Toco: q2-213min SVE: anterior lip/ 100/0  A/P: 35 y.o. G2P0010 @ 5456w5d in active labor 1. FWB: Cat. I 2. Labor: expectant managment Pain: continue with epidural GBS: negative  Myna HidalgoJennifer Mckenzy Salazar, DO [phone] (pager) [phone] (office)

## 2015-07-08 NOTE — MAU Note (Signed)
Contractions started last night around 9/10, getting closer and stronger.  Membranes swept yesterday was 1-2.  Has had some rusty d/c. No leaking

## 2015-07-08 NOTE — Anesthesia Procedure Notes (Signed)
Epidural Patient location during procedure: OB  Staffing Anesthesiologist: Sherrian DiversENENNY, Briggitte Boline  Preanesthetic Checklist Completed: patient identified, site marked, surgical consent, pre-op evaluation, timeout performed, IV checked, risks and benefits discussed and monitors and equipment checked  Epidural Patient position: sitting Prep: DuraPrep Patient monitoring: blood pressure and heart rate Approach: midline Location: L3-L4 Injection technique: LOR saline  Needle:  Needle type: Tuohy  Needle gauge: 17 G Needle length: 9 cm Needle insertion depth: 4.5 cm Catheter type: closed end flexible Catheter size: 19 Gauge Catheter at skin depth: 11 cm Test dose: negative and Other  Assessment Events: blood not aspirated, injection not painful, no injection resistance, negative IV test and no paresthesia  Additional Notes Tolerated well. Good LOR at 4.5 cm. No paresthesia.Reason for block:procedure for pain

## 2015-07-09 LAB — CBC
HCT: 31.6 % — ABNORMAL LOW (ref 36.0–46.0)
HEMATOCRIT: 26.4 % — AB (ref 36.0–46.0)
HEMOGLOBIN: 10.6 g/dL — AB (ref 12.0–15.0)
HEMOGLOBIN: 9.2 g/dL — AB (ref 12.0–15.0)
MCH: 31.7 pg (ref 26.0–34.0)
MCH: 32.5 pg (ref 26.0–34.0)
MCHC: 33.5 g/dL (ref 30.0–36.0)
MCHC: 34.8 g/dL (ref 30.0–36.0)
MCV: 93.3 fL (ref 78.0–100.0)
MCV: 94.6 fL (ref 78.0–100.0)
Platelets: 145 10*3/uL — ABNORMAL LOW (ref 150–400)
Platelets: 160 10*3/uL (ref 150–400)
RBC: 2.83 MIL/uL — AB (ref 3.87–5.11)
RBC: 3.34 MIL/uL — AB (ref 3.87–5.11)
RDW: 12.8 % (ref 11.5–15.5)
RDW: 12.8 % (ref 11.5–15.5)
WBC: 16.2 10*3/uL — AB (ref 4.0–10.5)
WBC: 20 10*3/uL — AB (ref 4.0–10.5)

## 2015-07-09 LAB — RPR: RPR Ser Ql: NONREACTIVE

## 2015-07-09 MED ORDER — PRENATAL MULTIVITAMIN CH
1.0000 | ORAL_TABLET | Freq: Every day | ORAL | Status: DC
  Administered 2015-07-10: 1 via ORAL
  Filled 2015-07-09: qty 1

## 2015-07-09 MED ORDER — SIMETHICONE 80 MG PO CHEW: 80.0000 mg | CHEWABLE_TABLET | ORAL | Status: DC | PRN

## 2015-07-09 MED ORDER — SENNOSIDES-DOCUSATE SODIUM 8.6-50 MG PO TABS
1.0000 | ORAL_TABLET | Freq: Two times a day (BID) | ORAL | Status: DC
  Administered 2015-07-09 – 2015-07-10 (×3): 1 via ORAL
  Filled 2015-07-09 (×3): qty 1

## 2015-07-09 MED ORDER — BENZOCAINE-MENTHOL 20-0.5 % EX AERO
1.0000 "application " | INHALATION_SPRAY | CUTANEOUS | Status: DC | PRN
  Administered 2015-07-09 – 2015-07-10 (×2): 1 via TOPICAL
  Filled 2015-07-09 (×2): qty 56

## 2015-07-09 MED ORDER — TRAMADOL HCL 50 MG PO TABS: 50.0000 mg | ORAL_TABLET | Freq: Four times a day (QID) | ORAL | Status: DC | PRN

## 2015-07-09 MED ORDER — FERROUS GLUCONATE 324 (38 FE) MG PO TABS
324.0000 mg | ORAL_TABLET | Freq: Every day | ORAL | Status: DC
  Administered 2015-07-10 (×2): 324 mg via ORAL
  Filled 2015-07-09 (×3): qty 1

## 2015-07-09 MED ORDER — COCONUT OIL OIL: 1.0000 "application " | TOPICAL_OIL | Status: DC | PRN

## 2015-07-09 MED ORDER — DIBUCAINE 1 % RE OINT: 1.0000 "application " | TOPICAL_OINTMENT | RECTAL | Status: DC | PRN

## 2015-07-09 MED ORDER — IBUPROFEN 600 MG PO TABS
600.0000 mg | ORAL_TABLET | Freq: Four times a day (QID) | ORAL | Status: DC
  Administered 2015-07-09 – 2015-07-10 (×6): 600 mg via ORAL
  Filled 2015-07-09 (×7): qty 1

## 2015-07-09 MED ORDER — WITCH HAZEL-GLYCERIN EX PADS: 1.0000 "application " | MEDICATED_PAD | CUTANEOUS | Status: DC | PRN

## 2015-07-09 MED ORDER — ACETAMINOPHEN 325 MG PO TABS
650.0000 mg | ORAL_TABLET | ORAL | Status: DC | PRN
  Administered 2015-07-09 – 2015-07-10 (×5): 650 mg via ORAL
  Filled 2015-07-09 (×5): qty 2

## 2015-07-09 MED ORDER — ONDANSETRON HCL 4 MG/2ML IJ SOLN: 4.0000 mg | INTRAMUSCULAR | Status: DC | PRN

## 2015-07-09 MED ORDER — ZOLPIDEM TARTRATE 5 MG PO TABS: 5.0000 mg | ORAL_TABLET | Freq: Every evening | ORAL | Status: DC | PRN

## 2015-07-09 MED ORDER — DIPHENHYDRAMINE HCL 25 MG PO CAPS: 25.0000 mg | ORAL_CAPSULE | Freq: Four times a day (QID) | ORAL | Status: DC | PRN

## 2015-07-09 MED ORDER — ONDANSETRON HCL 4 MG PO TABS: 4.0000 mg | ORAL_TABLET | ORAL | Status: DC | PRN

## 2015-07-09 MED ORDER — SENNOSIDES-DOCUSATE SODIUM 8.6-50 MG PO TABS
2.0000 | ORAL_TABLET | ORAL | Status: DC
  Administered 2015-07-09: 2 via ORAL
  Filled 2015-07-09: qty 2

## 2015-07-09 NOTE — Progress Notes (Signed)
Postpartum Note Day # 1  S:  Patient resting comfortable in bed.  Pain controlled.  Tolerating general diet. No flatus, no BM.  Lochia moderate.  Pt was dizzy with standing late last evening and has not yet ambulated.  Bed pan used to void overnight.  She denies n/v/f/c, SOB, or CP.  Pt plans on breastfeeding.  O: Temp:  [98 F (36.7 C)-99.3 F (37.4 C)] 98.4 F (36.9 C) (07/01 0606) Pulse Rate:  [59-99] 99 (07/01 0606) Resp:  [16-22] 18 (07/01 0606) BP: (100-165)/(52-92) 123/68 mmHg (07/01 0606) SpO2:  [99 %-100 %] 99 % (07/01 0606) Weight:  [86.183 kg (190 lb)] 86.183 kg (190 lb) (06/30 1535)   Gen: A&Ox3, NAD CV: RRR, no MRG Resp: CTAB Abdomen: soft, NT, ND +BS Uterus: firm, non-tender, @ umbilicus Ext: 1+ non-pitting edema, no calf tenderness bilaterally  Labs:  CBC Latest Ref Rng 07/09/2015 07/09/2015 07/08/2015  WBC 4.0 - 10.5 K/uL 16.2(H) 20.0(H) 12.2(H)  Hemoglobin 12.0 - 15.0 g/dL 1.6(X9.2(L) 10.6(L) 13.1  Hematocrit 36.0 - 46.0 % 26.4(L) 31.6(L) 37.9  Platelets 150 - 400 K/uL 145(L) 160 194   A/P: Pt is a 35 y.o. G2P1011 s/p NSVD, PPD #1  - Pain well controlled -GU: UOP is adequate, pt to ambulate and void this am on her own -GI: Tolerating general diet -Activity: encouraged sitting up to chair and ambulation as tolerated -Prophylaxis: early ambulation -PPH due to uterine atony- s/p cytotec, Hgb as above, VSS, will continue to monitor today -lactation consult today  DISPO: Continue with routine postpartum care  Myna HidalgoJennifer Nephtali Docken, DO [phone] (pager) [phone] (office)

## 2015-07-09 NOTE — Lactation Note (Signed)
This note was copied from a baby's chart. Lactation Consultation Note  P1, 15 hours old.  Jaime RN in room and helped mother latch [facility]. Taught mother how to latch in cross cradle for more depth with head support and then revert to cradle once latched. Encouraged mother to compress/massage breast to keep baby active during feedings and breastfeed on both breasts. Noted lots of short feedings.  Discussed waking techniques, burping and encouraged longer feedings. Discussed basics including cluster feeding.  Encouraged mother to apply ebm to nipples if nipples become sore. Mom encouraged to feed baby 8-12 times/24 hours and with feeding cues.  Mom made aware of O/P services, breastfeeding support groups, community resources, and our phone # for post-discharge questions.    Patient Name: Melanie Khan HQION'GToday's Date: 07/09/2015 Reason for consult: Initial assessment   Maternal Data Has patient been taught Hand Expression?: Yes Does the patient have breastfeeding experience prior to this delivery?: No  Feeding Feeding Type: Breast Fed  LATCH Score/Interventions Latch: Grasps breast easily, tongue down, lips flanged, rhythmical sucking. Intervention(s): Adjust position  Audible Swallowing: A few with stimulation Intervention(s): Skin to skin  Type of Nipple: Everted at rest and after stimulation  Comfort (Breast/Nipple): Soft / non-tender     Hold (Positioning): Assistance needed to correctly position infant at breast and maintain latch.  LATCH Score: 8  Lactation Tools Discussed/Used     Consult Status Consult Status: Follow-up Date: 07/10/15 Follow-up type: In-patient    Dahlia ByesBerkelhammer, Ruth [facility] 07/09/2015, 2:18 PM

## 2015-07-09 NOTE — Anesthesia Postprocedure Evaluation (Signed)
Anesthesia Post Note  Patient: Melanie Khan  Procedure(s) Performed: * No procedures listed *  Patient location during evaluation: Mother Baby Anesthesia Type: Epidural Level of consciousness: awake and alert Pain management: satisfactory to patient Vital Signs Assessment: post-procedure vital signs reviewed and stable Respiratory status: respiratory function stable Cardiovascular status: stable Postop Assessment: no headache, no backache, epidural receding, patient able to bend at knees, no signs of nausea or vomiting and adequate PO intake Anesthetic complications: no Comments: Comfort level was assessed by AnesthesiaTeam and the patient was pleased with the care, interventions, and services provided by the Department of Anesthesia.     Last Vitals:  Filed Vitals:   07/09/15 0153 07/09/15 0606  BP: 131/70 123/68  Pulse: 82 99  Temp: 37.4 C 36.9 C  Resp: 18 18    Last Pain:  Filed Vitals:   07/09/15 0610  PainSc: 5    Pain Goal: Patients Stated Pain Goal: 7 (07/08/15 1530)               Karleen DolphinFUSSELL,Atonya Templer

## 2015-07-09 NOTE — Progress Notes (Addendum)
In to assess pt's bleeding.  Pt reports she passed a large blood clot ~ orange size after evaluated by Dr. Charlotta Newtonzan this am.Bleeding has been minimal since.  Pt requests I spray Dermoplast on perineum.  Pt is getting Ibuprofen.  Pt declines Percocet due to nausea with it.  Agreeable to try Ultram prn.  Gen:  NAD Abd:  Fundus firm at umblicus Perineum:  No bleeding noted.  Laceration not examined. Lochia: Moderate, no clots.  Dermoplast applied.  Pad with icepack removed.  S/p SVD with increased bleeding postpartum. No signs of atony or hemorrhage at this time. Ultram 50 mg q 6 hours prn pain. Continue routine postpartum care.

## 2015-07-09 NOTE — Progress Notes (Signed)
At bedside due to increased bleeding and intermittent uterine atony.  Fundal massage performed, uterus firm with no expression of large amount of bleeding.  Bladder drained ~ 60cc- clear urine.  Cytotec placed 1000mcg per rectum.  Pt to continue with additional IV fluids and Pitocin.  CBC to be drawn.  VSS.  Will continue to closely monitor.  Myna HidalgoJennifer Kyira Volkert, DO [phone]) [phone] (office)

## 2015-07-10 MED ORDER — IBUPROFEN 600 MG PO TABS: 600.0000 mg | ORAL_TABLET | Freq: Four times a day (QID) | ORAL | Status: DC

## 2015-07-10 MED ORDER — FERROUS GLUCONATE 324 (38 FE) MG PO TABS: 324.0000 mg | ORAL_TABLET | Freq: Every day | ORAL | Status: DC

## 2015-07-10 NOTE — Progress Notes (Signed)
Postpartum Note Day # 2  S:  Patient resting comfortable in bed.  Pain controlled.  Tolerating general diet. +flatus, +BM  Lochia moderate.  Pt ambulating and voiding without difficulty. She denies n/v/f/c, SOB, or CP.  Pt breastfeeding and supplementing with formula.  O: Temp:  [98 F (36.7 C)] 98 F (36.7 C) (07/02 0610) Pulse Rate:  [74-113] 81 (07/02 0610) Resp:  [18] 18 (07/02 0610) BP: (106-122)/(54-82) 122/63 mmHg (07/02 0610) SpO2:  [99 %-100 %] 99 % (07/02 0610)   Gen: A&Ox3, NAD CV: RRR, no MRG Resp: CTAB Abdomen: soft, NT, ND +BS Uterus: firm, non-tender, @ umbilicus Ext: No edema, no calf tenderness bilaterally  Labs:  CBC Latest Ref Rng 07/09/2015 07/09/2015 07/08/2015  WBC 4.0 - 10.5 K/uL 16.2(H) 20.0(H) 12.2(H)  Hemoglobin 12.0 - 15.0 g/dL 5.7(Q9.2(L) 10.6(L) 13.1  Hematocrit 36.0 - 46.0 % 26.4(L) 31.6(L) 37.9  Platelets 150 - 400 K/uL 145(L) 160 194   A/P: Pt is a 35 y.o. G2P1011 s/p NSVD, PPD #2  - Pain well controlled -GU: Voiding freely -GI: Tolerating general diet -Activity: encouraged sitting up to chair and ambulation as tolerated -Prophylaxis: early ambulation -PPH due to uterine atony- s/p cytotec, Hgb as above, VSS, lochia improved  DISPO: Meeting postpartum milestones appropriately, plan for discharge home today.  Myna HidalgoJennifer Kasra Melvin, DO [phone] (pager) [phone] (office)

## 2015-07-10 NOTE — Lactation Note (Signed)
This note was copied from a baby's chart. Lactation Consultation Note  Patient Name: Girl Hampton AbbotMary ann Siple ZOXWR'UToday's Date: 07/10/2015 Reason for consult: Follow-up assessment   Follow up with first time mom of 36 hour old infant. Infant with 5 BF for 15-30 minutes, 1 attempt, 3 bottle feeds of formula of 20 cc, 3 voids and 2 stools in 24 hours preceding this assessment. Infant weight 7 lb 8.3 oz with weight loss of 4% since birth. LATCH Scores 8-9 by bedside RN. Hx significant for EBL 650 cc and increased Prolactin levels during or before pregnancy.  Mom reports infant was eating very frequently and she pumped and did not get anything so she thought the infant was not getting enough. She did not BF through the night. Discussed supply and demand and normalcy of cluster feeding in the NB period. Enc mom to BF first before offering bottle of formula to stimulate milk production. Advised mom to pump at least 4-6 x a day post BF to stimulate milk production. Advised that infant should receive all EBM via bottle that is pumped prior to formula.  Mom voiced understanding. Mom has a Medela DEBP at home for use.   Reviewed all Bf information in Taking Care of Baby and Me Booklet. Reviewed Engorgement prevention/treatment, comfort pumping and prepumping to soften areola as needed. Reviewed I/O and enc family to maintain feeding log and take to Ped visit. Reviewed LC Brochure, parents aware of OP services, BF Support Groups and LC phone #. Enc family to call with questions/concerns prn.     Maternal Data Does the patient have breastfeeding experience prior to this delivery?: No  Feeding Feeding Type: Bottle Fed - Formula Nipple Type: Regular  LATCH Score/Interventions                      Lactation Tools Discussed/Used WIC Program: No Pump Review: Milk Storage   Consult Status Consult Status: Complete Follow-up type: Call as needed    Ed BlalockSharon S Hice 07/10/2015, 10:45 AM

## 2015-07-10 NOTE — Discharge Summary (Signed)
OB Discharge Summary     Patient Name: Melanie Khan DOB: 05/19/1980 MRN: [id number]  Date of admission: 07/08/2015 Delivering MD: Myna HidalgoZAN, Kelina Beauchamp   Date of discharge: 07/10/2015  Admitting diagnosis: 39WKS,LABOR Intrauterine pregnancy: 3514w5d     Secondary diagnosis:  Active Problems:   Intrauterine normal pregnancy   Normal labor   Vaginal delivery  Additional problems: HSV2     Discharge diagnosis: Term Pregnancy Delivered                                                                                                Post partum procedures:none  Augmentation: AROM  Complications: None  Hospital course:  Onset of Labor With Vaginal Delivery     35 y.o. yo G2P1011 at 2714w5d was admitted in Latent Labor on 07/08/2015. Patient had an uncomplicated labor course as follows:  Membrane Rupture Time/Date: 4:55 PM ,07/08/2015   Intrapartum Procedures: Episiotomy: Right Mediolateral [4];Median [2]                                         Lacerations:     Patient had a delivery of a Viable infant. 07/08/2015  Information for the patient's newborn:  Melanie Khan, Girl Melanie AbrahamsMary ann [[id number]][[id number]]  Delivery Method: Vaginal, Spontaneous Delivery (Filed from Delivery Summary)    Pateint did have evidence of uterine atony with increased vaginal bleeding.  EBL: ~ 800cc.  She rc'd cytotec per rectum and additional Pitocin.  Uterus remained firm and bleeding improved and no further intervention was indicated.  She is ambulating, tolerating a regular diet, passing flatus, and urinating well. Patient is discharged home in stable condition on 07/10/2015.    Physical exam  Filed Vitals:   07/09/15 1020 07/09/15 1030 07/09/15 1741 07/10/15 0610  BP: 106/54 118/70 117/82 122/63  Pulse: 99 113 77 81  Temp:    98 F (36.7 C)  TempSrc:      Resp: 18 18 18 18   Height:      Weight:      SpO2:  100% 99% 99%   General: alert, cooperative and no distress Lochia: appropriate Uterine Fundus: firm Incision:  N/A DVT Evaluation: No evidence of DVT seen on physical exam. Labs: Lab Results  Component Value Date   WBC 16.2* 07/09/2015   HGB 9.2* 07/09/2015   HCT 26.4* 07/09/2015   MCV 93.3 07/09/2015   PLT 145* 07/09/2015   CMP Latest Ref Rng 01/01/2012  Glucose 70 - 99 mg/dL 87  BUN 6 - 23 mg/dL 15  Creatinine 5.780.50 - 4.691.10 mg/dL 6.290.67  Sodium 528135 - 413145 mEq/L 142  Potassium 3.5 - 5.3 mEq/L 4.2  Chloride 96 - 112 mEq/L 108  CO2 19 - 32 mEq/L 26  Calcium 8.4 - 10.5 mg/dL 9.4  Total Protein 6.0 - 8.3 g/dL 6.7  Total Bilirubin 0.3 - 1.2 mg/dL 0.4  Alkaline Phos 39 - 117 U/L 43  AST 0 - 37 U/L 17  ALT 0 - 35 U/L 13    Discharge  instruction: per After Visit Summary and "Baby and Me Booklet".  After visit meds:    Medication List    STOP taking these medications        norethindrone-ethinyl estradiol 1-20 MG-MCG tablet  Commonly known as:  GILDESS 1/20     valACYclovir 500 MG tablet  Commonly known as:  VALTREX      TAKE these medications        ferrous gluconate 324 MG tablet  Commonly known as:  FERGON  Take 1 tablet (324 mg total) by mouth daily with breakfast.     ibuprofen 600 MG tablet  Commonly known as:  ADVIL,MOTRIN  Take 1 tablet (600 mg total) by mouth every 6 (six) hours.     prenatal vitamin w/FE, FA 27-1 MG Tabs tablet  Take 1 tablet by mouth daily at 12 noon.        Diet: routine diet  Activity: Advance as tolerated. Pelvic rest for 6 weeks.   Outpatient follow up:2 weeks Follow up Appt:Future Appointments Date Time Provider Department Center  07/21/2015 12:00 AM [facility] ROOM [facility] None   Follow up Visit:No Follow-up on file.  Postpartum contraception: Progesterone only pills  Newborn Data: Live born female  Birth Weight: 7 lb 13.4 oz (3555 g) APGAR: 9, 9  Baby Feeding: Breast Disposition:home with mother   07/10/2015 Myna HidalgoZAN, Tayten Bergdoll, M, DO

## 2015-07-10 NOTE — Discharge Instructions (Signed)

## 2015-07-21 ENCOUNTER — Inpatient Hospital Stay (HOSPITAL_COMMUNITY): Admission: RE | Admit: 2015-07-21 | Payer: BC Managed Care – PPO | Source: Ambulatory Visit

## 2015-08-03 ENCOUNTER — Telehealth (HOSPITAL_COMMUNITY): Payer: Self-pay | Admitting: Lactation Services

## 2015-08-03 NOTE — Telephone Encounter (Signed)
Mom called with concerns about pumping. Has pumped twice but feels pump is not getting breast emptied. Reports no pain with pumping. Encouraged massage and hand expression before pumping, to relax and not watch how much milk is coming and hand expression after nursing. Asking about flange size- with no pain and she states is not rubbing in tunnel, probably ok. Can make appointment if she wants [place] to check for her. Will call if decides to do that, No further questions at present. To call back as needed.

## 2015-08-07 ENCOUNTER — Telehealth (HOSPITAL_COMMUNITY): Payer: Self-pay | Admitting: Lactation Services

## 2015-08-07 NOTE — Telephone Encounter (Signed)
Pt called b/c her 13 month old infant, Zenda Alpers, will become frantic and not latch onto the breast at times.  Pt has tried to nurse in a reclining position, but that is usually as a last resort. In case the issue is that Zenda Alpers feels that her mother's milk is flowing too fast, I recommended that Mom try laid-back nursing at the beginning of a nursing session or do some hand expressing initially to decrease the force of letdown. I also referred Mom to the web site www.biologicalnurturing.com for additional advice on laid-back nursing & gave her tips on paced bottle-feeding. Mom was given our 1st available LC appt,Mon, August 7th.    Note: In looking at [facility] records from the peds office, she gained 2 lbs in 2 weeks between 31 days of age & 27 days of age. Zenda Alpers will return to the peds office on Aug 2nd.   Glenetta Hew, RN, IBCLC

## 2015-08-15 ENCOUNTER — Ambulatory Visit (HOSPITAL_COMMUNITY)
Admission: RE | Admit: 2015-08-15 | Discharge: 2015-08-15 | Disposition: A | Discharge status: Home / Self Care | Payer: BC Managed Care – PPO | Source: Ambulatory Visit | Attending: Obstetrics & Gynecology | Admitting: Obstetrics & Gynecology

## 2015-08-15 NOTE — Lactation Note (Signed)
Lactation Consult  Mother's reason for visit:  Refusing to BF late in the day and at night. Visit Type:  OP  Appointment Notes:  Zenda AlpersSawyer has recently been having difficulty BF late in the day and at night.  Mom is questioning her milk supply and has increased her fluids and started an herbal supplement. Today she was attached using and off-center latch and mom was shown how to hold her closely (chest to breast).  She was also shown how to perform breast compressions.  It was noted that she had jaw quivering and occasional snapback. Both of these are suggestive of tongue restrictions.  She  detached at  5 minutes and burped. Then went back to the breast. Reminded mom to bring baby close. Re-weighed and transfer was 1.2 oz, placed in a football hold on the same breast and an additional 0.7 oz was transferred. Place in a football hold on the right breast and transferred an additional 1.5 oz Baby was fussy at the end of each breast and wanted more food after BF. She was given 10 ml of expressed BM. She ate a total of just over 3.5 oz and then fell asleep.   Plan is to feed on cue Post pump 3-4 times in 24 hours to drain breasts well and encourage increased milk supply. Use breast compressions to help with milk transfer. Tongue exercises, tummy time and tug o'war with the bottle. Follow-up as mother desires Consult:  Initial Lactation Consultant:  Soyla DryerJoseph, Zendayah  ________________________________________________________________________ Joan FloresBaby's Name:  Laurita QuintSawyer Eleanor Loyer Date of Birth:  07/08/2014 Pediatrician:  [facility] Gender:  female Gestational Age: 5983w5d (At Birth) Birth Weight:  7 lb 13.4 oz (3555 g) Weight at Discharge:  Weight: 7 lb 8.3 oz (3410 g)                                   Date of Discharge:  07/10/2015     [facility]   07/08[facility]15 2212 07/10/15 0110  Weight: 7 lb 13.4 oz (3555 g) 7 lb 8.3 oz (3410 g)  [facility]  10#[facility]    Location:Ped 08/10/2015 Weight today:  10#11.6 oz    ________________________________________________________________________  Mother's Name: Hampton AbbotMary ann Parran Type of delivery:  vaginal Breastfeeding Experience:  P1 Maternal Medical Conditions:  Infertility but conceived when not being treated, history of elevated prolactin levels, MRI to r/o pituitary tumor Maternal Medications:  PNV,stool  ________________________________________________________________________  Breastfeeding History (Post Discharge)  Frequency of breastfeeding:  8-12 times in 24 horus Duration of feeding:  10-40 minutes  Rarely pumps.  Infant Intake and Output Assessment  Voids:  6-10 in 24 hrs.  Color:  Clear yellow Stools:  6-10 in 24 hrs.  Color:  Yellow  ________________________________________________________________________

## 2015-08-17 ENCOUNTER — Telehealth (HOSPITAL_COMMUNITY): Payer: Self-pay | Admitting: Lactation Services

## 2015-08-17 NOTE — Telephone Encounter (Signed)
Met w/ Melanie Khan with OP yesterday.  Baby was refusing to breastfeed until this afternoon and now she is back at the breast again but at times as still been a difficult latch.  Recommend prepumping and post pumping to boost her milk supply.  Also recommend a power pumping session tomorrow to give her supply a boost.  Mother has been supplementing w/ formula and not pumping so her supply may be down.  Suggest she call back if it continues.

## 2015-08-19 ENCOUNTER — Telehealth (HOSPITAL_COMMUNITY): Payer: Self-pay | Admitting: Lactation Services

## 2015-08-19 NOTE — Telephone Encounter (Signed)
Mom's call returned. Mom has had some improvement in breastfeeding since her OP visit earlier this week, but is still looking for additional advice since it can still be a struggle at times to feed [place]. Additional tips given. Mom plans to come to our support group & may consider having an IBCLC do a home visit.  Glenetta HewKim Divine Hansley, RN, IBCLC

## 2017-08-19 LAB — OB RESULTS CONSOLE ABO/RH: RH Type: POSITIVE

## 2017-08-19 LAB — OB RESULTS CONSOLE HEPATITIS B SURFACE ANTIGEN: Hepatitis B Surface Ag: NEGATIVE

## 2017-08-19 LAB — OB RESULTS CONSOLE HIV ANTIBODY (ROUTINE TESTING): HIV: NONREACTIVE

## 2017-08-19 LAB — OB RESULTS CONSOLE ANTIBODY SCREEN: Antibody Screen: NEGATIVE

## 2017-08-19 LAB — OB RESULTS CONSOLE RUBELLA ANTIBODY, IGM: RUBELLA: IMMUNE

## 2017-08-19 LAB — OB RESULTS CONSOLE RPR: RPR: NONREACTIVE

## 2018-02-21 ENCOUNTER — Telehealth: Payer: Self-pay | Admitting: Family Medicine

## 2018-02-21 NOTE — Telephone Encounter (Signed)
Attempted to call patient with her BMZ appts. No answer, LVM to give the office a call back. Skip Estimable, CMA contacted [facility] to let them know that we could not contact the patient and also gave them the appt date and times.

## 2018-02-24 ENCOUNTER — Telehealth: Payer: Self-pay | Admitting: General Practice

## 2018-02-24 NOTE — Telephone Encounter (Signed)
Patient called and left message on nurse voicemail line stating she received a phone call about an injection, her doctor is Dr Charlotta Newton. She is returning our phone call.  Per chart review an order was received that patient needed to come in for BMZ the week of 2/24. Appts are currently scheduled for this week.  Called patient and discussed coming in for BMZ appt 2/25 & 2/26 at 3pm. Patient verbalized understanding and asked for later appt. Offered 4pm to patient and she agreed. Patient states her doctor told her to receive them 48 hours before her c-section on 3/3. Told patient that is not the information we received. Advised she contact her doctor and review the appts with her, if everything is okay then we can leave as is otherwise she should call us back. Patient verbalized understanding & had no questions.

## 2018-02-25 ENCOUNTER — Encounter (HOSPITAL_COMMUNITY): Payer: Self-pay

## 2018-02-25 ENCOUNTER — Telehealth (HOSPITAL_COMMUNITY): Payer: Self-pay | Admitting: *Deleted

## 2018-02-25 ENCOUNTER — Ambulatory Visit: Payer: BC Managed Care – PPO

## 2018-02-25 NOTE — Telephone Encounter (Signed)
Preadmission screen  

## 2018-02-26 ENCOUNTER — Telehealth (HOSPITAL_COMMUNITY): Payer: Self-pay | Admitting: *Deleted

## 2018-02-26 ENCOUNTER — Ambulatory Visit: Payer: BC Managed Care – PPO

## 2018-02-26 NOTE — Telephone Encounter (Signed)
Preadmission screen  

## 2018-02-27 ENCOUNTER — Telehealth (HOSPITAL_COMMUNITY): Payer: Self-pay | Admitting: *Deleted

## 2018-02-27 ENCOUNTER — Encounter (HOSPITAL_COMMUNITY): Payer: Self-pay

## 2018-02-27 NOTE — Telephone Encounter (Signed)
Preadmission screen  

## 2018-03-04 ENCOUNTER — Ambulatory Visit (INDEPENDENT_AMBULATORY_CARE_PROVIDER_SITE_OTHER): Payer: BC Managed Care – PPO

## 2018-03-04 DIAGNOSIS — O4403 Placenta previa specified as without hemorrhage, third trimester: Secondary | ICD-10-CM | POA: Diagnosis not present

## 2018-03-04 NOTE — Progress Notes (Signed)
Chales Abrahams Ealey here for Betamethasone  Injection.  Injection administered without complication. Patient will return in 24 hours for next injection.  Ralene Bathe, RN 03/04/2018  7:35 PM

## 2018-03-05 ENCOUNTER — Ambulatory Visit (INDEPENDENT_AMBULATORY_CARE_PROVIDER_SITE_OTHER): Payer: BC Managed Care – PPO

## 2018-03-05 DIAGNOSIS — O4403 Placenta previa specified as without hemorrhage, third trimester: Secondary | ICD-10-CM

## 2018-03-05 MED ORDER — BETAMETHASONE SOD PHOS & ACET 6 (3-3) MG/ML IJ SUSP
12.0000 mg | Freq: Once | INTRAMUSCULAR | Status: AC
  Administered 2018-03-04: 12 mg via INTRAMUSCULAR

## 2018-03-05 MED ORDER — BETAMETHASONE SOD PHOS & ACET 6 (3-3) MG/ML IJ SUSP
12.0000 mg | Freq: Once | INTRAMUSCULAR | Status: AC
  Administered 2018-03-05: 12 mg via INTRAMUSCULAR

## 2018-03-05 NOTE — Progress Notes (Signed)
Chart reviewed for nurse visit. Agree with plan of care.   Samin Milke Lorraine, CNM 03/05/2018 8:57 AM   

## 2018-03-05 NOTE — Progress Notes (Signed)
Chales Abrahams Lawson here for second dose Betamethasone  Injection .  Injection administered without complication. Patient will return to her OB office for continued prenatal care.    Ralene Bathe, RN 03/05/2018  4:19 PM

## 2018-03-05 NOTE — Addendum Note (Signed)
Addended by: Faythe Casa on: 03/05/2018 04:28 PM   Modules accepted: Orders

## 2018-03-06 NOTE — Progress Notes (Signed)
I have reviewed the chart and agree with nursing staff's documentation of this patient's encounter.  Nilaya Bouie, MD 03/06/2018 7:34 AM    

## 2018-03-07 NOTE — H&P (Signed)
HPI: 38 y/o G3P1011 @ [redacted]w[redacted]d estimated gestational age (as dated by LMP c/w 20 week ultrasound) presents for scheduled primary C-section due to placenta previa.   no Leaking of Fluid,   no Vaginal Bleeding,   no Uterine Contractions,  + Fetal Movement.  Prenatal care has been provided by Dr. Charlotta Newton  ROS: no HA, no epigastric pain, no visual changes.    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, asymptomatic   Prenatal Transfer Tool  Maternal Diabetes: No Genetic Screening: Normal Maternal Ultrasounds/Referrals: Normal Fetal Ultrasounds or other Referrals:  None Maternal Substance Abuse:  No Significant Maternal Medications:  None Significant Maternal Lab Results: GBS unknown   PNL:  GBS unknown, Rub Immune, Hep B neg, RPR NR, HIV neg, GC/C neg, glucola:normal 2/10: Hgb 11.8 Blood type: A positive, antibody neg  Immunizations: Tdap: 1/27 Flu: 10/15/2017  OBHx: FTNSVD c/b PPH due to atony SABx1 PMHx:  HSV Meds:  PNV Allergy:  No Known Allergies SurgHx: Right ulna repair with metal plate SocHx:   No Tobacco, no  EtOH, no Illicit Drugs  O: LMP 07/04/2017  Gen. AAOx3, NAD CV.  RRR  No murmur.  Resp. CTAB, no wheeze or crackles. Abd. Gravid,  no tenderness,  no rigidity,  no guarding Extr.  no edema B/L , no calf tenderness, neg Homan's B/L FHT: 140 by doppler  Labs: see orders  A/P:  38 y.o. G3P1011 @ [redacted]w[redacted]d EGA who presents for scheduled primary C-section due to complete placenta previa -FWB:  Reassuring by doppler -NPO -LR @ 125cc/hr -CBC, Type & cross 2upRBCs -SCDs to OR -Ancef 2g IV -Risk benefits and alternatives of cesarean section were discussed with the patient including but not limited to infection, bleeding, damage to bowel , bladder and baby with the need for further surgery. Discussed concern  for possible bleeding requiring transfusion or further intervention.  Pt voiced understanding and desires to proceed.   Myna Hidalgo, DO [phone] (cell) [phone] (office)

## 2018-03-10 ENCOUNTER — Encounter (HOSPITAL_COMMUNITY): Payer: Self-pay | Admitting: Anesthesiology

## 2018-03-10 ENCOUNTER — Encounter (HOSPITAL_COMMUNITY)
Admission: RE | Admit: 2018-03-10 | Discharge: 2018-03-10 | Disposition: A | Discharge status: Home / Self Care | Payer: BC Managed Care – PPO | Source: Ambulatory Visit | Attending: Pediatrics | Admitting: Pediatrics

## 2018-03-10 HISTORY — DX: Obsessive-compulsive disorder, unspecified: F42.9

## 2018-03-10 HISTORY — DX: Anxiety disorder, unspecified: F41.9

## 2018-03-10 HISTORY — DX: Depression, unspecified: F32.A

## 2018-03-10 HISTORY — DX: Major depressive disorder, single episode, unspecified: F32.9

## 2018-03-10 LAB — CBC
HCT: 37.2 % (ref 36.0–46.0)
Hemoglobin: 12.4 g/dL (ref 12.0–15.0)
MCH: 31.7 pg (ref 26.0–34.0)
MCHC: 33.3 g/dL (ref 30.0–36.0)
MCV: 95.1 fL (ref 80.0–100.0)
Platelets: 235 10*3/uL (ref 150–400)
RBC: 3.91 MIL/uL (ref 3.87–5.11)
RDW: 12.9 % (ref 11.5–15.5)
WBC: 11.9 10*3/uL — ABNORMAL HIGH (ref 4.0–10.5)
nRBC: 0 % (ref 0.0–0.2)

## 2018-03-10 NOTE — Patient Instructions (Signed)
Melanie Khan  03/10/2018   Your procedure is scheduled on:  03/11/2018  Arrive at 0530 at Entrance C on [address] at [facility] and [employer]. You are invited to use the FREE valet parking or use theVisitor's parking deck.  Pick up the phone at the desk and dial [phone].  Call this number if you have problems the morning of surgery: [phone]  Remember:   Do not eat food:(After Midnight) Desps de medianoche.  Do not drink clear liquids: (After Midnight) Desps de medianoche.  Take these medicines the morning of surgery with A SIP OF WATER:  none   Do not wear jewelry, make-up or nail polish.  Do not wear lotions, powders, or perfumes. Do not wear deodorant.  Do not shave 48 hours prior to surgery.  Do not bring valuables to the hospital.  [facility] is not   responsible for any belongings or valuables brought to the hospital.  Contacts, dentures or bridgework may not be worn into surgery.  Leave suitcase in the car. After surgery it may be brought to your room.  For patients admitted to the hospital, checkout time is 11:00 AM the day of              discharge.      Please read over the following fact sheets that you were given:     Preparing for Surgery

## 2018-03-10 NOTE — Anesthesia Preprocedure Evaluation (Addendum)
Anesthesia Evaluation  Patient identified by MRN, date of birth, ID band Patient awake    Reviewed: Allergy & Precautions, NPO status , Patient's Chart, lab work & pertinent test results  Airway Mallampati: II  TM Distance: >3 FB Neck ROM: Full    Dental no notable dental hx. (+) Teeth Intact   Pulmonary neg pulmonary ROS,    Pulmonary exam normal        Cardiovascular negative cardio ROS Normal cardiovascular exam Rhythm:Regular Rate:Normal     Neuro/Psych PSYCHIATRIC DISORDERS Anxiety Depression OCDnegative neurological ROS     GI/Hepatic Neg liver ROS, GERD  ,  Endo/Other  Hx/o elevated prolactin levels  Renal/GU negative Renal ROS  negative genitourinary   Musculoskeletal negative musculoskeletal ROS (+)   Abdominal   Peds  Hematology negative hematology ROS (+)   Anesthesia Other Findings   Reproductive/Obstetrics (+) Pregnancy Placenta previa 36 5/7weeks AMA HSV                            Anesthesia Physical Anesthesia Plan  ASA: II  Anesthesia Plan: Spinal   Post-op Pain Management:    Induction:   PONV Risk Score and Plan: 4 or greater and Scopolamine patch - Pre-op, Ondansetron and Treatment may vary due to age or medical condition  Airway Management Planned: Natural Airway  Additional Equipment:   Intra-op Plan:   Post-operative Plan:   Informed Consent: I have reviewed the patients History and Physical, chart, labs and discussed the procedure including the risks, benefits and alternatives for the proposed anesthesia with the patient or authorized representative who has indicated his/her understanding and acceptance.     Dental advisory given  Plan Discussed with: CRNA and Surgeon  Anesthesia Plan Comments:        Anesthesia Quick Evaluation

## 2018-03-11 ENCOUNTER — Inpatient Hospital Stay (HOSPITAL_COMMUNITY)
Admission: RE | Admit: 2018-03-11 | Discharge: 2018-03-13 | DRG: 788 | Disposition: A | Discharge status: Home / Self Care | Payer: BC Managed Care – PPO | Attending: Obstetrics & Gynecology | Admitting: Obstetrics & Gynecology

## 2018-03-11 ENCOUNTER — Other Ambulatory Visit: Payer: Self-pay

## 2018-03-11 ENCOUNTER — Encounter (HOSPITAL_COMMUNITY)
Admission: RE | Disposition: A | Discharge status: Home / Self Care | Payer: Self-pay | Source: Home / Self Care | Attending: Obstetrics & Gynecology

## 2018-03-11 ENCOUNTER — Encounter (HOSPITAL_COMMUNITY): Payer: Self-pay | Admitting: *Deleted

## 2018-03-11 ENCOUNTER — Inpatient Hospital Stay (HOSPITAL_COMMUNITY): Payer: BC Managed Care – PPO | Admitting: Anesthesiology

## 2018-03-11 DIAGNOSIS — O4403 Placenta previa specified as without hemorrhage, third trimester: Secondary | ICD-10-CM | POA: Diagnosis present

## 2018-03-11 DIAGNOSIS — O4400 Placenta previa specified as without hemorrhage, unspecified trimester: Secondary | ICD-10-CM | POA: Diagnosis present

## 2018-03-11 DIAGNOSIS — Z3A36 36 weeks gestation of pregnancy: Secondary | ICD-10-CM

## 2018-03-11 LAB — RPR: RPR: NONREACTIVE

## 2018-03-11 LAB — PREPARE RBC (CROSSMATCH)

## 2018-03-11 SURGERY — Surgical Case
Anesthesia: Spinal

## 2018-03-11 MED ORDER — OXYTOCIN 10 UNIT/ML IJ SOLN
INTRAMUSCULAR | Status: AC
  Filled 2018-03-11: qty 4

## 2018-03-11 MED ORDER — ZOLPIDEM TARTRATE 5 MG PO TABS: 5.0000 mg | ORAL_TABLET | Freq: Every evening | ORAL | Status: DC | PRN

## 2018-03-11 MED ORDER — SOD CITRATE-CITRIC ACID 500-334 MG/5ML PO SOLN: 30.0000 mL | Freq: Once | ORAL | Status: DC

## 2018-03-11 MED ORDER — KETOROLAC TROMETHAMINE 30 MG/ML IJ SOLN
30.0000 mg | Freq: Four times a day (QID) | INTRAMUSCULAR | Status: AC | PRN
  Administered 2018-03-11: 30 mg via INTRAVENOUS

## 2018-03-11 MED ORDER — PHENYLEPHRINE 40 MCG/ML (10ML) SYRINGE FOR IV PUSH (FOR BLOOD PRESSURE SUPPORT)
PREFILLED_SYRINGE | INTRAVENOUS | Status: DC | PRN
  Administered 2018-03-11 (×2): 40 ug via INTRAVENOUS

## 2018-03-11 MED ORDER — NALBUPHINE HCL 10 MG/ML IJ SOLN: 5.0000 mg | INTRAMUSCULAR | Status: DC | PRN

## 2018-03-11 MED ORDER — SOD CITRATE-CITRIC ACID 500-334 MG/5ML PO SOLN
ORAL | Status: AC
  Filled 2018-03-11: qty 15

## 2018-03-11 MED ORDER — WITCH HAZEL-GLYCERIN EX PADS: 1.0000 "application " | MEDICATED_PAD | CUTANEOUS | Status: DC | PRN

## 2018-03-11 MED ORDER — BUPIVACAINE IN DEXTROSE 0.75-8.25 % IT SOLN
INTRATHECAL | Status: DC | PRN
  Administered 2018-03-11: 1.8 mL via INTRATHECAL

## 2018-03-11 MED ORDER — DIPHENHYDRAMINE HCL 25 MG PO CAPS: 25.0000 mg | ORAL_CAPSULE | Freq: Four times a day (QID) | ORAL | Status: DC | PRN

## 2018-03-11 MED ORDER — MORPHINE SULFATE (PF) 0.5 MG/ML IJ SOLN
INTRAMUSCULAR | Status: AC
  Filled 2018-03-11: qty 10

## 2018-03-11 MED ORDER — ACETAMINOPHEN 325 MG PO TABS
650.0000 mg | ORAL_TABLET | ORAL | Status: DC | PRN
  Administered 2018-03-12 – 2018-03-13 (×8): 650 mg via ORAL
  Filled 2018-03-11 (×8): qty 2

## 2018-03-11 MED ORDER — FENTANYL CITRATE (PF) 100 MCG/2ML IJ SOLN: 25.0000 ug | INTRAMUSCULAR | Status: DC | PRN

## 2018-03-11 MED ORDER — MORPHINE SULFATE (PF) 0.5 MG/ML IJ SOLN
INTRAMUSCULAR | Status: DC | PRN
  Administered 2018-03-11: .15 mg via INTRATHECAL

## 2018-03-11 MED ORDER — DIBUCAINE 1 % RE OINT: 1.0000 "application " | TOPICAL_OINTMENT | RECTAL | Status: DC | PRN

## 2018-03-11 MED ORDER — SCOPOLAMINE 1 MG/3DAYS TD PT72
1.0000 | MEDICATED_PATCH | Freq: Once | TRANSDERMAL | Status: DC
  Administered 2018-03-11: 1.5 mg via TRANSDERMAL

## 2018-03-11 MED ORDER — FENTANYL CITRATE (PF) 100 MCG/2ML IJ SOLN
INTRAMUSCULAR | Status: DC | PRN
  Administered 2018-03-11: 15 ug via INTRATHECAL

## 2018-03-11 MED ORDER — OXYTOCIN 10 UNIT/ML IJ SOLN
INTRAVENOUS | Status: DC | PRN
  Administered 2018-03-11: 40 [IU] via INTRAVENOUS

## 2018-03-11 MED ORDER — IBUPROFEN 800 MG PO TABS
800.0000 mg | ORAL_TABLET | Freq: Four times a day (QID) | ORAL | Status: DC
  Administered 2018-03-12 – 2018-03-13 (×3): 800 mg via ORAL
  Filled 2018-03-11 (×3): qty 1

## 2018-03-11 MED ORDER — SOD CITRATE-CITRIC ACID 500-334 MG/5ML PO SOLN
30.0000 mL | ORAL | Status: AC
  Administered 2018-03-11: 30 mL via ORAL

## 2018-03-11 MED ORDER — ONDANSETRON HCL 4 MG/2ML IJ SOLN: 4.0000 mg | Freq: Three times a day (TID) | INTRAMUSCULAR | Status: DC | PRN

## 2018-03-11 MED ORDER — SCOPOLAMINE 1 MG/3DAYS TD PT72
MEDICATED_PATCH | TRANSDERMAL | Status: AC
  Filled 2018-03-11: qty 1

## 2018-03-11 MED ORDER — SUCCINYLCHOLINE CHLORIDE 200 MG/10ML IV SOSY
PREFILLED_SYRINGE | INTRAVENOUS | Status: AC
  Filled 2018-03-11: qty 10

## 2018-03-11 MED ORDER — PRENATAL MULTIVITAMIN CH
1.0000 | ORAL_TABLET | Freq: Every day | ORAL | Status: DC
  Administered 2018-03-12 – 2018-03-13 (×2): 1 via ORAL
  Filled 2018-03-11 (×2): qty 1

## 2018-03-11 MED ORDER — SODIUM CHLORIDE 0.9 % IV SOLN
INTRAVENOUS | Status: DC | PRN
  Administered 2018-03-11: 08:00:00 via INTRAVENOUS

## 2018-03-11 MED ORDER — MEPERIDINE HCL 25 MG/ML IJ SOLN: 6.2500 mg | INTRAMUSCULAR | Status: DC | PRN

## 2018-03-11 MED ORDER — LACTATED RINGERS IV SOLN
INTRAVENOUS | Status: DC
  Administered 2018-03-11: 12:00:00 via INTRAVENOUS

## 2018-03-11 MED ORDER — SENNOSIDES-DOCUSATE SODIUM 8.6-50 MG PO TABS
2.0000 | ORAL_TABLET | ORAL | Status: DC
  Administered 2018-03-11 – 2018-03-13 (×2): 2 via ORAL
  Filled 2018-03-11 (×2): qty 2

## 2018-03-11 MED ORDER — PHENYLEPHRINE HCL-NACL 20-0.9 MG/250ML-% IV SOLN
INTRAVENOUS | Status: AC
  Filled 2018-03-11: qty 250

## 2018-03-11 MED ORDER — CEFAZOLIN SODIUM-DEXTROSE 2-4 GM/100ML-% IV SOLN
2.0000 g | INTRAVENOUS | Status: AC
  Administered 2018-03-11: 2 g via INTRAVENOUS

## 2018-03-11 MED ORDER — OXYTOCIN 40 UNITS IN NORMAL SALINE INFUSION - SIMPLE MED
INTRAVENOUS | Status: AC
  Filled 2018-03-11: qty 1000

## 2018-03-11 MED ORDER — FENTANYL CITRATE (PF) 100 MCG/2ML IJ SOLN
INTRAMUSCULAR | Status: AC
  Filled 2018-03-11: qty 2

## 2018-03-11 MED ORDER — PHENYLEPHRINE HCL-NACL 20-0.9 MG/250ML-% IV SOLN
INTRAVENOUS | Status: DC | PRN
  Administered 2018-03-11: 60 ug/min via INTRAVENOUS

## 2018-03-11 MED ORDER — SIMETHICONE 80 MG PO CHEW
80.0000 mg | CHEWABLE_TABLET | ORAL | Status: DC | PRN
  Administered 2018-03-11: 80 mg via ORAL

## 2018-03-11 MED ORDER — ONDANSETRON HCL 4 MG/2ML IJ SOLN
INTRAMUSCULAR | Status: AC
  Filled 2018-03-11: qty 2

## 2018-03-11 MED ORDER — PROPOFOL 10 MG/ML IV BOLUS
INTRAVENOUS | Status: AC
  Filled 2018-03-11: qty 20

## 2018-03-11 MED ORDER — KETOROLAC TROMETHAMINE 30 MG/ML IJ SOLN
30.0000 mg | Freq: Four times a day (QID) | INTRAMUSCULAR | Status: AC
  Administered 2018-03-11 – 2018-03-12 (×3): 30 mg via INTRAVENOUS
  Filled 2018-03-11 (×4): qty 1

## 2018-03-11 MED ORDER — PHENYLEPHRINE 40 MCG/ML (10ML) SYRINGE FOR IV PUSH (FOR BLOOD PRESSURE SUPPORT)
PREFILLED_SYRINGE | INTRAVENOUS | Status: AC
  Filled 2018-03-11: qty 10

## 2018-03-11 MED ORDER — NALOXONE HCL 0.4 MG/ML IJ SOLN: 0.4000 mg | INTRAMUSCULAR | Status: DC | PRN

## 2018-03-11 MED ORDER — SODIUM CHLORIDE 0.9% FLUSH: 3.0000 mL | INTRAVENOUS | Status: DC | PRN

## 2018-03-11 MED ORDER — KETOROLAC TROMETHAMINE 30 MG/ML IJ SOLN
30.0000 mg | Freq: Four times a day (QID) | INTRAMUSCULAR | Status: AC | PRN
  Administered 2018-03-11: 30 mg via INTRAMUSCULAR

## 2018-03-11 MED ORDER — DIPHENHYDRAMINE HCL 50 MG/ML IJ SOLN: 12.5000 mg | INTRAMUSCULAR | Status: DC | PRN

## 2018-03-11 MED ORDER — LACTATED RINGERS IV SOLN
INTRAVENOUS | Status: DC
  Administered 2018-03-11 (×2): via INTRAVENOUS

## 2018-03-11 MED ORDER — MENTHOL 3 MG MT LOZG: 1.0000 | LOZENGE | OROMUCOSAL | Status: DC | PRN

## 2018-03-11 MED ORDER — SODIUM CHLORIDE 0.9 % IR SOLN
Status: DC | PRN
  Administered 2018-03-11: 1

## 2018-03-11 MED ORDER — LACTATED RINGERS IV SOLN: INTRAVENOUS | Status: DC

## 2018-03-11 MED ORDER — NALOXONE HCL 4 MG/10ML IJ SOLN
1.0000 ug/kg/h | INTRAVENOUS | Status: DC | PRN
  Filled 2018-03-11: qty 5

## 2018-03-11 MED ORDER — SIMETHICONE 80 MG PO CHEW
80.0000 mg | CHEWABLE_TABLET | ORAL | Status: DC
  Administered 2018-03-12 – 2018-03-13 (×2): 80 mg via ORAL
  Filled 2018-03-11 (×2): qty 1

## 2018-03-11 MED ORDER — DIPHENHYDRAMINE HCL 25 MG PO CAPS: 25.0000 mg | ORAL_CAPSULE | ORAL | Status: DC | PRN

## 2018-03-11 MED ORDER — COCONUT OIL OIL: 1.0000 "application " | TOPICAL_OIL | Status: DC | PRN

## 2018-03-11 MED ORDER — NALBUPHINE HCL 10 MG/ML IJ SOLN: 5.0000 mg | Freq: Once | INTRAMUSCULAR | Status: DC | PRN

## 2018-03-11 MED ORDER — CEFAZOLIN SODIUM-DEXTROSE 2-4 GM/100ML-% IV SOLN
INTRAVENOUS | Status: AC
  Filled 2018-03-11: qty 100

## 2018-03-11 MED ORDER — ONDANSETRON HCL 4 MG/2ML IJ SOLN
INTRAMUSCULAR | Status: DC | PRN
  Administered 2018-03-11: 4 mg via INTRAVENOUS

## 2018-03-11 MED ORDER — OXYTOCIN 40 UNITS IN NORMAL SALINE INFUSION - SIMPLE MED: 2.5000 [IU]/h | INTRAVENOUS | Status: AC

## 2018-03-11 MED ORDER — SIMETHICONE 80 MG PO CHEW
80.0000 mg | CHEWABLE_TABLET | Freq: Three times a day (TID) | ORAL | Status: DC
  Administered 2018-03-11 – 2018-03-13 (×4): 80 mg via ORAL
  Filled 2018-03-11 (×5): qty 1

## 2018-03-11 MED ORDER — KETOROLAC TROMETHAMINE 30 MG/ML IJ SOLN
INTRAMUSCULAR | Status: AC
  Filled 2018-03-11: qty 1

## 2018-03-11 MED ORDER — EPHEDRINE 5 MG/ML INJ
INTRAVENOUS | Status: AC
  Filled 2018-03-11: qty 10

## 2018-03-11 SURGICAL SUPPLY — 38 items
BENZOIN TINCTURE PRP APPL 2/3 (GAUZE/BANDAGES/DRESSINGS) ×3 IMPLANT
CHLORAPREP W/TINT 26ML (MISCELLANEOUS) ×3 IMPLANT
CLAMP CORD UMBIL (MISCELLANEOUS) IMPLANT
CLOSURE WOUND 1/2 X4 (GAUZE/BANDAGES/DRESSINGS) ×1
CLOTH BEACON ORANGE TIMEOUT ST (SAFETY) ×3 IMPLANT
DERMABOND ADVANCED (GAUZE/BANDAGES/DRESSINGS)
DERMABOND ADVANCED .7 DNX12 (GAUZE/BANDAGES/DRESSINGS) IMPLANT
DRSG OPSITE POSTOP 4X10 (GAUZE/BANDAGES/DRESSINGS) ×3 IMPLANT
ELECT REM PT RETURN 9FT ADLT (ELECTROSURGICAL) ×3
ELECTRODE REM PT RTRN 9FT ADLT (ELECTROSURGICAL) ×1 IMPLANT
EXTRACTOR VACUUM KIWI (MISCELLANEOUS) IMPLANT
GLOVE BIOGEL PI IND STRL 7.0 (GLOVE) ×3 IMPLANT
GLOVE BIOGEL PI INDICATOR 7.0 (GLOVE) ×6
GLOVE ECLIPSE 6.5 STRL STRAW (GLOVE) ×3 IMPLANT
GOWN STRL REUS W/TWL LRG LVL3 (GOWN DISPOSABLE) ×9 IMPLANT
HEMOSTAT ARISTA ABSORB 3G PWDR (HEMOSTASIS) ×2 IMPLANT
KIT ABG SYR 3ML LUER SLIP (SYRINGE) IMPLANT
NDL HYPO 25X5/8 SAFETYGLIDE (NEEDLE) IMPLANT
NEEDLE HYPO 25X5/8 SAFETYGLIDE (NEEDLE) IMPLANT
NS IRRIG 1000ML POUR BTL (IV SOLUTION) ×3 IMPLANT
PACK C SECTION WH (CUSTOM PROCEDURE TRAY) ×3 IMPLANT
PAD ABD 7.5X8 STRL (GAUZE/BANDAGES/DRESSINGS) ×3 IMPLANT
PAD OB MATERNITY 4.3X12.25 (PERSONAL CARE ITEMS) ×3 IMPLANT
PENCIL SMOKE EVAC W/HOLSTER (ELECTROSURGICAL) ×3 IMPLANT
RTRCTR C-SECT PINK 25CM LRG (MISCELLANEOUS) ×3 IMPLANT
STRIP CLOSURE SKIN 1/2X4 (GAUZE/BANDAGES/DRESSINGS) ×2 IMPLANT
SUT PLAIN 0 NONE (SUTURE) IMPLANT
SUT PLAIN 2 0 XLH (SUTURE) IMPLANT
SUT VIC AB 0 CT1 27 (SUTURE) ×4
SUT VIC AB 0 CT1 27XBRD ANBCTR (SUTURE) ×2 IMPLANT
SUT VIC AB 0 CTX 36 (SUTURE) ×6
SUT VIC AB 0 CTX36XBRD ANBCTRL (SUTURE) ×3 IMPLANT
SUT VIC AB 2-0 CT1 27 (SUTURE) ×4
SUT VIC AB 2-0 CT1 TAPERPNT 27 (SUTURE) ×1 IMPLANT
SUT VIC AB 4-0 KS 27 (SUTURE) ×3 IMPLANT
TOWEL OR 17X24 6PK STRL BLUE (TOWEL DISPOSABLE) ×3 IMPLANT
TRAY FOLEY W/BAG SLVR 14FR LF (SET/KITS/TRAYS/PACK) IMPLANT
WATER STERILE IRR 1000ML POUR (IV SOLUTION) ×3 IMPLANT

## 2018-03-11 NOTE — Op Note (Signed)
PreOp Diagnosis: 1) Intrauterine pregnancy @ [redacted]w[redacted]d 2) Placenta previa PostOp Diagnosis: same Procedure: Primary C-section Surgeon: Dr. Myna Hidalgo Assistant: Dr. Marylou Flesher Anesthesia: spinal Complications: none EBL: 386cc UOP: 500cc Fluids: 2700cc  Findings: Female infant from vertex presentation.  Normal uterus, tubes and ovaries bilaterally.  PROCEDURE:  Informed consent was obtained from the patient with risks, benefits, complications, treatment options, and expected outcomes discussed with the patient.  The patient concurred with the proposed plan, giving informed consent with form signed.   The patient was taken to Operating Room, and identified with the procedure verified as C-Section Delivery with Time Out. With induction of anesthesia, the patient was prepped and draped in the usual sterile fashion. A Pfannenstiel incision was made and carried down through the subcutaneous tissue to the fascia. The fascia was incised in the midline and extended transversely. The superior aspect of the fascial incision was grasped with Kochers elevated and the underlying muscle dissected off. The inferior aspect of the facial incision was in similar fashion, grasped elevated and rectus muscles dissected off. The peritoneum was identified and entered. Peritoneal incision was extended longitudinally. The utero-vesical peritoneal reflection was identified and incised transversely with the [facility] scissors, the incision extended laterally, the bladder flap created digitally. A low transverse uterine incision was made and the infant's head delivered atraumatically. After the umbilical cord was clamped and cut cord blood was obtained for evaluation.   The placenta was removed intact and appeared normal- there were no complications with removal of the placenta. The uterine outline, tubes and ovaries appeared normal. The uterine incision was closed with running locked sutures of 0 Vicryl and a second layer of  the same stitch was used in an imbricating fashion.  Excellent hemostasis was obtained.  The pericolic gutters were then cleared of all clots and debris. Arista was placed due to slight oozing along the vesicouterine edge.  The peritoneum was closed in a running fashion using 2-0 vicryl. The fascia was then reapproximated with running sutures of 0 Vicryl. The subcutaneous tissue was reapproximated with 2-0 plain gut suture.   The skin was closed with 4-0 vicryl in a subcuticular fashion.  Instrument, sponge, and needle counts were correct prior the abdominal closure and at the conclusion of the case. The patient was taken to recovery in stable condition.  Dr. Dion Body was present due to potential complications regarding the placenta previa.  Myna Hidalgo, DO [phone] (cell) [phone] (office)

## 2018-03-11 NOTE — Anesthesia Postprocedure Evaluation (Signed)
Anesthesia Post Note  Patient: Mary ann Ebey  Procedure(s) Performed: CESAREAN SECTION (N/A )     Patient location during evaluation: PACU Anesthesia Type: Spinal Level of consciousness: oriented and awake and alert Pain management: pain level controlled Vital Signs Assessment: post-procedure vital signs reviewed and stable Respiratory status: spontaneous breathing, respiratory function stable and nonlabored ventilation Cardiovascular status: blood pressure returned to baseline and stable Postop Assessment: no headache, no backache, no apparent nausea or vomiting, spinal receding and patient able to bend at knees Anesthetic complications: no    Last Vitals:  Vitals:   03/11/18 0930 03/11/18 0945  BP: (!) 109/59 (!) 101/57  Pulse: 68 67  Resp: 18 12  Temp:  36.4 C  SpO2: 100% 98%    Last Pain:  Vitals:   03/11/18 0945  TempSrc: Oral  PainSc:    Pain Goal:    LLE Motor Response: Purposeful movement (03/11/18 0945)   RLE Motor Response: Purposeful movement (03/11/18 0945)       Epidural/Spinal Function Cutaneous sensation: Able to Discern Pressure (03/11/18 0915), Patient able to flex knees: No (03/11/18 0915), Patient able to lift hips off bed: No (03/11/18 0915), Back pain beyond tenderness at insertion site: No (03/11/18 0915), Progressively worsening motor and/or sensory loss: No (03/11/18 0915), Bowel and/or bladder incontinence post epidural: No (03/11/18 0915)  Marsalis Beaulieu A.

## 2018-03-11 NOTE — Consult Note (Signed)
Neonatology Note:   Attendance at C-section:    I was asked by Dr. Ozan to attend this primary C/S at 36 5/7 weeks due to placenta previa. The mother is a G3P1A1 A pos, GBS not done with an otherwise uncomplicated pregnancy. ROM at delivery, fluid clear. Infant vigorous with good spontaneous cry and tone. Delayed cord clamping was not done. Needed only minimal bulb suctioning. Ap 9/9. Lungs clear to ausc in DR. Infant is able to remain with her mother for skin to skin time under nursing supervision. Transferred to the care of Pediatrician.   Wylodean Shimmel C. Keymani Mclean, MD 

## 2018-03-11 NOTE — Lactation Note (Signed)
This note was copied from a baby's chart. Lactation Consultation Note  Patient Name: Melanie Khan Date: 03/11/2018 Reason for consult: Initial assessment;Late-preterm 34-36.6wks  "Melanie Khan" went to breast with relative ease. Swallows were verified by cervical auscultation (a suck:swallow ratio of 1:1 was noted).   Infant was still interested in feeding after having fed on both breasts. Hand expression was taught to Mom & the resulting colostrum was spoon-fed to infant. Mom stated a preference not to use a bottle just yet, so Melanie Khan was cup-fed with formula. She required pacing during cup feeding, so will likely need a slower flow nipple once bottle feeding is initiated. I spoke with Mom about offering a bottle before day of discharge so we can ascertain which nipple flow rate is best for the infant.   Breast pump parts were washed & placed to air dry in clean, dry basin.    Lurline Hare [facility] 03/11/2018, 8:55 PM

## 2018-03-11 NOTE — Interval H&P Note (Signed)
History and Physical Interval Note:  03/11/2018 6:53 AM  Melanie Khan  has presented today for surgery, with the diagnosis of O44.00 placenta previa antepartum  The various methods of treatment have been discussed with the patient and family. After consideration of risks, benefits and other options for treatment, the patient has consented to  Procedure(s): CESAREAN SECTION (N/A) as a surgical intervention .  The patient's history has been reviewed, patient examined, no change in status, stable for surgery.  I have reviewed the patient's chart and labs.  Questions were answered to the patient's satisfaction.     [name]

## 2018-03-11 NOTE — Anesthesia Procedure Notes (Signed)
Spinal  Patient location during procedure: OR Start time: 03/11/2018 7:32 AM End time: 03/11/2018 7:37 AM Staffing Anesthesiologist: Mal Amabile, MD Performed: anesthesiologist  Preanesthetic Checklist Completed: patient identified, site marked, surgical consent, pre-op evaluation, timeout performed, IV checked, risks and benefits discussed and monitors and equipment checked Spinal Block Patient position: sitting Prep: site prepped and draped and DuraPrep Patient monitoring: heart rate, cardiac monitor, continuous pulse ox and blood pressure Approach: midline Location: L3-4 Injection technique: single-shot Needle Needle type: Pencan  Needle gauge: 24 G Needle length: 9 cm Needle insertion depth: 5.5 cm Assessment Sensory level: T4 Additional Notes Patient tolerated procedure well. Adequate sensory level.

## 2018-03-11 NOTE — Lactation Note (Signed)
This note was copied from a baby's chart. Lactation Consultation Note  Patient Name: Melanie Khan [id number] Date: 03/11/2018 Reason for consult: Initial assessment;Late-preterm 34-36.6wks  Initial visit at 12 hours of life. Mom is a P2 who nursed her 1st child for 1 year. Mom has already used the DEBP once.   LPI's feeding behaviors were discussed, along with the possibility of needing to supplement with EBM or formula.  Mom to call for me to return to observe at the next feeding.    Lurline Hare [facility] 03/11/2018, 7:58 PM

## 2018-03-11 NOTE — Transfer of Care (Signed)
Immediate Anesthesia Transfer of Care Note  Patient: Melanie Khan  Procedure(s) Performed: CESAREAN SECTION (N/A )  Patient Location: PACU  Anesthesia Type:Spinal  Level of Consciousness: awake, alert  and oriented  Airway & Oxygen Therapy: Patient Spontanous Breathing  Post-op Assessment: Report given to RN and Post -op Vital signs reviewed and stable  Post vital signs: Reviewed and stable  Last Vitals:  Vitals Value Taken Time  BP 100/43 03/11/2018  8:45 AM  Temp    Pulse 71 03/11/2018  8:47 AM  Resp 18 03/11/2018  8:47 AM  SpO2 98 % 03/11/2018  8:47 AM  Vitals shown include unvalidated device data.  Last Pain:  Vitals:   03/11/18 0620  TempSrc: Oral         Complications: No apparent anesthesia complications

## 2018-03-12 LAB — CBC
HCT: 31.4 % — ABNORMAL LOW (ref 36.0–46.0)
Hemoglobin: 9.9 g/dL — ABNORMAL LOW (ref 12.0–15.0)
MCH: 30.3 pg (ref 26.0–34.0)
MCHC: 31.5 g/dL (ref 30.0–36.0)
MCV: 96 fL (ref 80.0–100.0)
Platelets: 196 10*3/uL (ref 150–400)
RBC: 3.27 MIL/uL — ABNORMAL LOW (ref 3.87–5.11)
RDW: 12.7 % (ref 11.5–15.5)
WBC: 11.7 10*3/uL — ABNORMAL HIGH (ref 4.0–10.5)
nRBC: 0 % (ref 0.0–0.2)

## 2018-03-12 MED ORDER — OXYCODONE HCL 5 MG PO TABS
5.0000 mg | ORAL_TABLET | Freq: Four times a day (QID) | ORAL | Status: DC | PRN
  Filled 2018-03-12: qty 1

## 2018-03-12 NOTE — Lactation Note (Addendum)
This note was copied from a baby's chart. Lactation Consultation Note: Baby born at 69 w 5 d P2 Experienced BF mom reports baby is latching well. Has been supplementing with formula by spoon because she does not feel baby is getting enough, Had low milk supply with her first baby. Has DEBP setup in room but has only used it once. Suggested pumping at least 4 times today to promote milk supply. Baby asleep in mom's arms. No questions at present. To call for assist prn  Patient Name: Girl shellby meltzer [id number] Date: 03/12/2018 Reason for consult: Follow-up assessment   Maternal Data Formula Feeding for Exclusion: No Does the patient have breastfeeding experience prior to this delivery?: Yes  Feeding  LATCH Score Interventions  Lactation Tools Discussed/Used     Consult Status Consult Status: PRN    Pamelia Hoit 03/12/2018, 1:03 PM

## 2018-03-12 NOTE — Progress Notes (Signed)
MOB was referred for history of depression/anxiety.  * Referral screened out by Clinical Social Worker because none of the following criteria appear to apply:  -History of anxiety/depression during this pregnancy, or of post-partum depression following prior delivery. -Diagnosis of anxiety and/or depression within last 3 years OR * MOB's symptoms currently being treated with medication and/or therapy.  Please contact the Clinical Social Worker if needs arise or by MOB request.  Melanie Khan, LCSWA  [facility] [phone]  

## 2018-03-12 NOTE — Progress Notes (Signed)
Postpartum Note Day # 1  S:  Patient resting comfortable in bed.  Pain controlled.  Tolerating general diet. + flatus, no BM.  Lochia moderate.  Ambulating without difficulty.  She denies n/v/f/c, SOB, or CP.  Pt plans on breastfeeding.  O: Temp:  [97.5 F (36.4 C)-99 F (37.2 C)] 98.4 F (36.9 C) (03/04 0627) Pulse Rate:  [57-74] 73 (03/04 0627) Resp:  [12-20] 14 (03/04 0627) BP: (89-109)/(40-64) 89/51 (03/04 0627) SpO2:  [97 %-100 %] 99 % (03/04 0627) Gen: A&Ox3, NAD CV: RRR, no MRG Resp: CTAB Abdomen: soft, NT, ND +BS Uterus: firm, non-tender, below umbilicus Incision: c/d/i, bandage on Ext: No edema, no calf tenderness bilaterally  Labs:  Recent Labs    03/10/18 0930 03/12/18 0534  HGB 12.4 9.9*    A/P: Pt is a 38 y.o. [id number] s/p primary C-section due to placenta previa, POD#1  - Pain well controlled -GU: UOP is adequate, voiding freely this am -GI: Tolerating general diet -Activity: encouraged sitting up to chair and ambulation as tolerated -Prophylaxis: early ambulation -Labs: appropriate decline for recent C-section, plan for PNV with iron daily  Continue with routine postop care, will likely plan to discharge home tomorrow on POD#2  Myna Hidalgo, DO [phone] (cell) [phone] (office)

## 2018-03-13 MED ORDER — DOCUSATE SODIUM 100 MG PO CAPS: 100.0000 mg | ORAL_CAPSULE | Freq: Two times a day (BID) | ORAL | 0 refills | Status: DC

## 2018-03-13 MED ORDER — ONDANSETRON HCL 4 MG PO TABS: 4.0000 mg | ORAL_TABLET | Freq: Three times a day (TID) | ORAL | 0 refills | Status: DC | PRN

## 2018-03-13 MED ORDER — IBUPROFEN 600 MG PO TABS
600.0000 mg | ORAL_TABLET | Freq: Four times a day (QID) | ORAL | Status: DC
  Administered 2018-03-13: 600 mg via ORAL
  Filled 2018-03-13: qty 1

## 2018-03-13 MED ORDER — IBUPROFEN 600 MG PO TABS: 600.0000 mg | ORAL_TABLET | Freq: Four times a day (QID) | ORAL | 0 refills | Status: DC

## 2018-03-13 MED ORDER — ONDANSETRON HCL 4 MG PO TABS: 4.0000 mg | ORAL_TABLET | Freq: Three times a day (TID) | ORAL | Status: DC | PRN

## 2018-03-13 MED ORDER — OXYCODONE HCL 5 MG PO TABS: 5.0000 mg | ORAL_TABLET | Freq: Four times a day (QID) | ORAL | 0 refills | Status: AC | PRN

## 2018-03-13 NOTE — Discharge Instructions (Signed)

## 2018-03-13 NOTE — Lactation Note (Signed)
This note was copied from a baby's chart. Lactation Consultation Note  Patient Name: Melanie Khan Date: 03/13/2018 Reason for consult: Follow-up assessment  Baby is 49 hours old  6% weight loss/ less than 6 pounds today ( 5-15.9 oz )  / LPT 365/7 weeks.  Per mom the last feeding was 5 mins at 8:30 am and baby was sleepy.  LC reviewed potential feeding behaviors with a LPT / less than 6 pound infant.  Mom reports breast are fuller and warmer, hearing more swallows.  Baby woke up, wet diaper x 2 / and LC assisted with pillow support/ and mom  Latched in the cradle position. LC showed mom how flanged the baby's lips should  Be for increased stimulation for increased let down.  Multiple swallows noted and baby fed for 7 mins and released. Mom asked should I switch  Her to the other breast. LC recommended to re-latch on the same breast in a different position.  Mom didn't follow recommendation and latched without pillows on the other breast / laid back/  LC checked lips lines / flanged upper lips/ and ease chin.  Mom had mentioned she had problems with engorgement with her 1st baby/ has a DEBP Medela at home/  Not sure if it is working right. LC recommended changing the tubing and the membrane.  Also calling their insurance for their benefits DEBP. LC instructed mom on the use of hand pump,  And recommended to help the baby stay awake and stay in a good consistent pattern- breast massage/  Hand express/ pre-pump with hand pump to prime the milk ducts.  Mom denies sore nipples.  LC also recommended since the baby is less than 6 pounds now and 6 % weight loss to add post pumping  Both breast for 10 -15 mins after 5 feedings a day to have milk to supplement back to baby.  LC stressed the importance of feeding the baby with feeding cues and 8-12 times day.  If the weight loss increases when weight is done tomorrow - feed the baby 15 -20 mins max and supplement  Back with at least  30 ml and post pump until the baby is back to birth weight.  Discuss nutritive vs non- nutritive feeding patterns and  The importance of watching the baby for hanging out latched.,  Importance of STS feedings until the baby is back to birth weight and gaining steadily/ and can stay awake for feeding.  Mother informed of post-discharge support and given phone number to the lactation department, including services for phone call assistance; out-patient appointments; and breastfeeding support group. List of other breastfeeding resources in the community given in the handout. Encouraged mother to call for problems or concerns related to breastfeeding.      Maternal Data Has patient been taught Hand Expression?: Yes  Feeding Feeding Type: Breast Fed  LATCH Score Latch: Grasps breast easily, tongue down, lips flanged, rhythmical sucking.  Audible Swallowing: Spontaneous and intermittent  Type of Nipple: Everted at rest and after stimulation  Comfort (Breast/Nipple): Filling, red/small blisters or bruises, mild/mod discomfort  Hold (Positioning): Assistance needed to correctly position infant at breast and maintain latch.  LATCH Score: 8  Interventions Interventions: Breast feeding basics reviewed  Lactation Tools Discussed/Used Tools: Pump Breast pump type: Manual Pump Review: Setup, frequency, and cleaning;Milk Storage Initiated by:: MAI reviewed  Date initiated:: 03/13/18   Consult Status Consult Status: Complete Date: 03/13/18 Follow-up type: In-patient    Melanie Khan  03/13/2018, 9:42 AM

## 2018-03-13 NOTE — Discharge Summary (Signed)
OB Discharge Summary     Patient Name: Melanie Khan DOB: [birth year] MRN: [id number]  Date of admission: 03/11/2018 Delivering MD: Myna Hidalgo   Date of discharge: 03/13/2018  Admitting diagnosis: O44.00 placenta previa antepartum Intrauterine pregnancy: 110w0d     Secondary diagnosis:  Active Problems:   Placenta previa affecting delivery  Additional problems: none     Discharge diagnosis: Preterm Pregnancy Delivered                                                                                                Post partum procedures:none  Augmentation: n/a  Complications: None  Hospital course:  Sceduled C/S   38 y.o. yo G3P1011 at [redacted]w[redacted]d was admitted to the hospital 03/11/2018 for scheduled cesarean section with the following indication:Previa.  Membrane Rupture Time/Date: 7:56 AM ,03/11/2018   Patient delivered a Viable infant.03/11/2018  Details of operation can be found in separate operative note.  Pateint had an uncomplicated postpartum course.  She is ambulating, tolerating a regular diet, passing flatus, and urinating well. Patient is discharged home in stable condition on  03/13/18         Physical exam  Vitals:   03/12/18 0627 03/12/18 1500 03/12/18 2249 03/13/18 0641  BP: (!) 89/51 (!) 88/42 (!) 103/53 (!) 87/44  Pulse: 73 65 73 67  Resp: 14 15 18 16   Temp: 98.4 F (36.9 C) 98.4 F (36.9 C) 98.2 F (36.8 C) 98.9 F (37.2 C)  TempSrc: Oral Oral Oral Oral  SpO2: 99%     Weight:      Height:       General: alert, cooperative and no distress Lochia: appropriate Uterine Fundus: firm Incision: Dressing is clean, dry, and intact DVT Evaluation: No evidence of DVT seen on physical exam. Labs: Lab Results  Component Value Date   WBC 11.7 (H) 03/12/2018   HGB 9.9 (L) 03/12/2018   HCT 31.4 (L) 03/12/2018   MCV 96.0 03/12/2018   PLT 196 03/12/2018   CMP Latest Ref Rng & Units 01/01/2012  Glucose 70 - 99 mg/dL 87  BUN 6 - 23 mg/dL 15  Creatinine 9.32 - 6.71  mg/dL 2.45  Sodium 809 - 983 mEq/L 142  Potassium 3.5 - 5.3 mEq/L 4.2  Chloride 96 - 112 mEq/L 108  CO2 19 - 32 mEq/L 26  Calcium 8.4 - 10.5 mg/dL 9.4  Total Protein 6.0 - 8.3 g/dL 6.7  Total Bilirubin 0.3 - 1.2 mg/dL 0.4  Alkaline Phos 39 - 117 U/L 43  AST 0 - 37 U/L 17  ALT 0 - 35 U/L 13    Discharge instruction: per After Visit Summary and "Baby and Me Booklet".  After visit meds:  Allergies as of 03/13/2018   No Known Allergies     Medication List    TAKE these medications   acetaminophen 325 MG tablet Commonly known as:  TYLENOL Take 650 mg by mouth every 6 (six) hours as needed for moderate pain or headache.   ATHLETES FOOT CREAM EX Apply 1 application topically daily as needed (athletes foot).   docusate sodium 100  MG capsule Commonly known as:  COLACE Take 1 capsule (100 mg total) by mouth 2 (two) times daily.   ferrous sulfate 325 (65 FE) MG tablet Take 325 mg by mouth daily with breakfast.   hydrocortisone cream 1 % Apply 1 application topically daily as needed for itching.   ibuprofen 600 MG tablet Commonly known as:  ADVIL,MOTRIN Take 1 tablet (600 mg total) by mouth every 6 (six) hours.   ondansetron 4 MG tablet Commonly known as:  ZOFRAN Take 1 tablet (4 mg total) by mouth every 8 (eight) hours as needed for nausea or vomiting.   oxyCODONE 5 MG immediate release tablet Commonly known as:  Oxy IR/ROXICODONE Take 1 tablet (5 mg total) by mouth every 6 (six) hours as needed for up to 7 days for moderate pain or severe pain (pain).   PRENATAL ADULT GUMMY/DHA/FA PO Take 1 capsule by mouth daily.       Diet: routine diet  Activity: Advance as tolerated. Pelvic rest for 6 weeks.   Outpatient follow up:2 weeks Follow up Appt:No future appointments. Follow up Visit:No follow-ups on file.  Postpartum contraception: Undecided  Newborn Data: Live born female  Birth Weight: 6 lb 6.5 oz (2906 g) APGAR: 9, 9  Newborn Delivery   Birth date/time:   03/11/2018 07:56:00 Delivery type:  C-Section, Low Transverse Trial of labor:  No C-section categorization:  Primary     Baby Feeding: Bottle and Breast Disposition:home with mother   03/13/2018 [name], DO

## 2018-03-14 LAB — BPAM RBC
BLOOD PRODUCT EXPIRATION DATE: 202003282359
Blood Product Expiration Date: 202003292359
ISSUE DATE / TIME: 202003030741
ISSUE DATE / TIME: 202003030741
UNIT TYPE AND RH: 6200
Unit Type and Rh: 6200

## 2018-03-14 LAB — TYPE AND SCREEN
ABO/RH(D): A POS
Antibody Screen: NEGATIVE
UNIT DIVISION: 0
Unit division: 0

## 2018-06-26 ENCOUNTER — Encounter (HOSPITAL_COMMUNITY): Payer: Self-pay

## 2018-07-08 ENCOUNTER — Encounter: Payer: Self-pay | Admitting: Cardiology

## 2018-07-08 NOTE — Progress Notes (Signed)
Patient referred by Lavone Orn, MD for screening for CAD  Subjective:   Melanie Khan, female    DOB: [birth year], 38 y.o.   MRN: [id number]   Chief Complaint  Patient presents with  . Family history of CAD    HPI  38 y.o. Caucasian female with no significant past medical history.  Patient's father passed 2 weeks ago due to myocardial infarction.  Patient wanted to be proactive and screen herself for CAD given her family history.  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  Her second child was born 4 months ago.  She tries to stay active at baseline, and denies any chest pain, shortness of breath.  She occasionally has episodes self lightheadedness lasting for brief few seconds, on getting up from sitting position.  However, she denies any presyncope or syncope.  She also denies any palpitations, orthopnea, PND, leg edema.   Past Medical History:  Diagnosis Date  . Anxiety   . Depression   . Elevated prolactin level (Section)   . HSV-2 infection   . OCD (obsessive compulsive disorder)      Past Surgical History:  Procedure Laterality Date  . CESAREAN SECTION N/A 03/11/2018   Procedure: CESAREAN SECTION;  Surgeon: Janyth Pupa, DO;  Location: [facility] LD ORS;  Service: Obstetrics;  Laterality: N/A;  . right arm fracture  2012  . WISDOM TOOTH EXTRACTION       Social History   Socioeconomic History  . Marital status: Married    Spouse name: Not on file  . Number of children: 2  . Years of education: Not on file  . Highest education level: Not on file  Occupational History  . Not on file  Social Needs  . Financial resource strain: Not hard at all  . Food insecurity    Worry: Never true    Inability: Never true  . Transportation needs    Medical: No    Non-medical: Not on file  Tobacco Use  . Smoking status: Never Smoker  . Smokeless tobacco: Never Used  Substance and Sexual Activity  . Alcohol use: Yes    Comment: once a  week; not while preg  . Drug use: No  . Sexual activity: Yes    Birth control/protection: Pill  Lifestyle  . Physical activity    Days per week: Not on file    Minutes per session: Not on file  . Stress: To some extent  Relationships  . Social Herbalist on phone: Not on file    Gets together: Not on file    Attends religious service: Not on file    Active member of club or organization: Not on file    Attends meetings of clubs or organizations: Not on file    Relationship status: Not on file  . Intimate partner violence    Fear of current or ex partner: No    Emotionally abused: No    Physically abused: No    Forced sexual activity: No  Other Topics Concern  . Not on file  Social History Narrative  . Not on file     Family History  Problem Relation Age of Onset  . Hypertension Paternal Grandfather   . Stroke Paternal Aunt   . Hypertension Maternal Grandfather   . Diabetes Maternal Grandfather   . Heart attack Father      Current Outpatient Medications on File Prior to Visit  Medication Sig  Dispense Refill  . Prenatal MV & Min w/FA-DHA (PRENATAL ADULT GUMMY/DHA/FA PO) Take 1 capsule by mouth daily.     No current facility-administered medications on file prior to visit.     Cardiovascular studies:  EKG 07/09/2018: Sinus rhythm 68 bpm. Nonspecific T wave inversion lead V3.   Recent labs: Results for Luther HearingCOURTNEY, MARY [facility] (MRN [id number]) as of 07/08/2018 14:59  Ref. Range 03/12/2018 05:34  WBC Latest Ref Range: 4.0 - 10.5 K/uL 11.7 (H)  RBC Latest Ref Range: 3.87 - 5.11 MIL/uL 3.27 (L)  Hemoglobin Latest Ref Range: 12.0 - 15.0 g/dL 9.9 (L)  HCT Latest Ref Range: 36.0 - 46.0 % 31.4 (L)  MCV Latest Ref Range: 80.0 - 100.0 fL 96.0  MCH Latest Ref Range: 26.0 - 34.0 pg 30.3  MCHC Latest Ref Range: 30.0 - 36.0 g/dL 40.931.5  RDW Latest Ref Range: 11.5 - 15.5 % 12.7  Platelets Latest Ref Range: 150 - 400 K/uL 196  nRBC Latest Ref Range: 0.0 - 0.2 % 0.0      Review of Systems  Constitution: Negative for decreased appetite, malaise/fatigue, weight gain and weight loss.  HENT: Negative for congestion.   Eyes: Negative for visual disturbance.  Cardiovascular: Negative for chest pain, dyspnea on exertion, leg swelling, palpitations and syncope.  Respiratory: Negative for cough.   Endocrine: Negative for cold intolerance.  Hematologic/Lymphatic: Does not bruise/bleed easily.  Skin: Negative for itching and rash.  Musculoskeletal: Negative for myalgias.  Gastrointestinal: Negative for abdominal pain, nausea and vomiting.  Genitourinary: Negative for dysuria.  Neurological: Positive for light-headedness (Occasional). Negative for dizziness and weakness.  Psychiatric/Behavioral: The patient is not nervous/anxious.   All other systems reviewed and are negative.       Vitals:   07/09/18 0852 07/09/18 0911  BP: 107/65 124/61  Pulse: 80   Temp: 97.9 F (36.6 C)   SpO2: 99%      Body mass index is 19.94 kg/m. Filed Weights   07/09/18 0844 07/09/18 0852  Weight: 135 lb (61.2 kg) 135 lb (61.2 kg)     Objective:   Physical Exam  Constitutional: She is oriented to person, place, and time. She appears well-developed and well-nourished. No distress.  HENT:  Head: Normocephalic and atraumatic.  Eyes: Pupils are equal, round, and reactive to light. Conjunctivae are normal.  Neck: No JVD present.  Cardiovascular: Normal rate, regular rhythm and intact distal pulses.  Pulmonary/Chest: Effort normal and breath sounds normal. She has no wheezes. She has no rales.  Abdominal: Soft. Bowel sounds are normal. There is no rebound.  Musculoskeletal:        General: No edema.  Lymphadenopathy:    She has no cervical adenopathy.  Neurological: She is alert and oriented to person, place, and time. No cranial nerve deficit.  Skin: Skin is warm and dry.  Psychiatric: She has a normal mood and affect.  Nursing note and vitals reviewed.          Assessment & Recommendations:   38 year old Caucasian female with family history of CAD, here for screening visit.  Patient is completely asymptomatic from CAD standpoint.  Recommend lipid panel, lipoprotein a, apolipoprotein B, and calcium score for screening and re-stratification.  I recommended heart healthy diet that includes more of lean meat, fish, fruits, vegetables, nuts, olives (Meditarranean diet) and less of processed food, red meat, dairy, cheese, fried food, sugar, excess salt.  Recommend daily exercise with at least 30 min walking or 15 min high intensity exercise most days  a week.  I will see her on as needed basis.   Thank you for referring the patient to us. Please feel free to contact with any questions.  Elder NegusManish J Patwardhan, MD [facility] Pager: [phone] Office: [phone] If no answer Cell [phone]

## 2018-07-09 ENCOUNTER — Ambulatory Visit: Payer: BC Managed Care – PPO | Admitting: Cardiology

## 2018-07-09 ENCOUNTER — Other Ambulatory Visit: Payer: Self-pay

## 2018-07-09 ENCOUNTER — Encounter: Payer: Self-pay | Admitting: Cardiology

## 2018-07-09 VITALS — BP 124/61 | HR 80 | Temp 97.9°F | Ht 69.0 in | Wt 135.0 lb

## 2018-07-09 DIAGNOSIS — Z13220 Encounter for screening for lipoid disorders: Secondary | ICD-10-CM | POA: Diagnosis not present

## 2018-07-09 DIAGNOSIS — Z136 Encounter for screening for cardiovascular disorders: Secondary | ICD-10-CM

## 2018-07-09 DIAGNOSIS — Z8249 Family history of ischemic heart disease and other diseases of the circulatory system: Secondary | ICD-10-CM | POA: Diagnosis not present

## 2018-07-16 LAB — APOLIPOPROTEIN B: Apolipoprotein B: 62 mg/dL (ref <90)

## 2018-07-16 LAB — LIPOPROTEIN A (LPA): Lipoprotein (a): 16.5 nmol/L (ref <75.0)

## 2018-07-16 LAB — LIPID PANEL
Chol/HDL Ratio: 2.5 ratio (ref 0.0–4.4)
Cholesterol, Total: 124 mg/dL (ref 100–199)
HDL: 49 mg/dL (ref >39)
LDL Calculated: 68 mg/dL (ref 0–99)
Triglycerides: 37 mg/dL (ref 0–149)
VLDL Cholesterol Cal: 7 mg/dL (ref 5–40)

## 2018-10-31 ENCOUNTER — Other Ambulatory Visit: Payer: Self-pay

## 2018-10-31 DIAGNOSIS — Z20822 Contact with and (suspected) exposure to covid-19: Secondary | ICD-10-CM

## 2018-11-01 LAB — NOVEL CORONAVIRUS, NAA: SARS-CoV-2, NAA: NOT DETECTED

## 2019-10-12 ENCOUNTER — Other Ambulatory Visit: Payer: Self-pay | Admitting: Podiatry

## 2019-10-12 ENCOUNTER — Other Ambulatory Visit: Payer: Self-pay

## 2019-10-12 ENCOUNTER — Ambulatory Visit (INDEPENDENT_AMBULATORY_CARE_PROVIDER_SITE_OTHER): Payer: BC Managed Care – PPO

## 2019-10-12 ENCOUNTER — Ambulatory Visit (INDEPENDENT_AMBULATORY_CARE_PROVIDER_SITE_OTHER): Payer: BC Managed Care – PPO | Admitting: Podiatry

## 2019-10-12 DIAGNOSIS — S92535A Nondisplaced fracture of distal phalanx of left lesser toe(s), initial encounter for closed fracture: Secondary | ICD-10-CM | POA: Diagnosis not present

## 2019-10-12 DIAGNOSIS — S99922A Unspecified injury of left foot, initial encounter: Secondary | ICD-10-CM

## 2019-10-12 DIAGNOSIS — S99929A Unspecified injury of unspecified foot, initial encounter: Secondary | ICD-10-CM

## 2019-10-12 NOTE — Patient Instructions (Signed)
Toe Fracture A toe fracture is a break in one of the toe bones (phalanges). This may happen if you:  Drop a heavy object on your toe.  Stub your toe.  Twist your toe.  Exercise the same way too much. What are the signs or symptoms? The main symptoms are swelling and pain in the toe. You may also have:  Bruising.  Stiffness.  Numbness.  A change in the way the toe looks.  Broken bones that poke through the skin.  Blood under the toenail. How is this treated? Treatments may include:  Taping the broken toe to a toe that is next to it (buddy taping).  Wearing a shoe that has a wide, rigid sole to protect the toe and to limit its movement.  Wearing a cast.  Surgery. This may be needed if the: ? Pieces of broken bone are out of place. ? Bone pokes through the skin.  Physical therapy. Follow these instructions at home: If you have a shoe:  Wear the shoe as told by your doctor. Remove it only as told by your doctor.  Loosen the shoe if your toes tingle, become numb, or turn cold and blue.  Keep the shoe clean and dry. If you have a cast:  Do not put pressure on any part of the cast until it is fully hardened. This may take a few hours.  Do not stick anything inside the cast to scratch your skin.  Check the skin around the cast every day. Tell your doctor about any concerns.  You may put lotion on dry skin around the edges of the cast.  Do not put lotion on the skin under the cast.  Keep the cast clean and dry. Bathing  Do not take baths, swim, or use a hot tub until your doctor says it is okay. Ask your doctor if you can take showers.  If the shoe or cast is not waterproof: ? Do not let it get wet. ? Cover it with a watertight covering when you take a bath or a shower. Activity  Do not use your foot to support your body weight until your doctor says it is okay.  Use crutches as told by your doctor.  Ask your doctor what activities are safe for you  during recovery.  Avoid activities as told by your doctor.  Do exercises as told by your doctor or therapist. Driving  Do not drive or use heavy machinery while taking pain medicine.  Do not drive while wearing a cast on a foot that you use for driving. Managing pain, stiffness, and swelling   Put ice on the injured area if told by your doctor: ? Put ice in a plastic bag. ? Place a towel between your skin and the bag.  If you have a shoe, remove it as told by your doctor.  If you have a cast, place a towel between your cast and the bag. ? Leave the ice on for 20 minutes, 2-3 times per day.  Raise (elevate) the injured area above the level of your heart while you are sitting or lying down. General instructions  If your toe was taped to a toe that is next to it, follow your doctor's instructions for changing the gauze and tape. Change it more often: ? If the gauze and tape get wet. If this happens, dry the space between the toes. ? If the gauze and tape are too tight and they cause your toe to become pale   or to lose feeling (go numb).  If your doctor did not give you a protective shoe, wear sturdy shoes that support your foot. Your shoes should not: ? Pinch your toes. ? Fit tightly against your toes.  Do not use any tobacco products, including cigarettes, chewing tobacco, or e-cigarettes. These can delay bone healing. If you need help quitting, ask your doctor.  Take medicines only as told by your doctor.  Keep all follow-up visits as told by your doctor. This is important. Contact a doctor if:  Your pain medicine is not helping.  You have a fever.  You notice a bad smell coming from your cast. Get help right away if:  You lose feeling (have numbness) in your toe or foot, and it is getting worse.  Your toe or your foot tingles.  Your toe or your foot gets cold or turns blue.  You have redness or swelling in your toe or foot, and it is getting worse.  You have very  bad pain. Summary  A toe fracture is a break in one of the toe bones.  Use ice and raise your foot. This will help lessen pain and swelling.  Use crutches as told by your doctor. This information is not intended to replace advice given to you by your health care provider. Make sure you discuss any questions you have with your health care provider. Document Revised: 02/28/2017 Document Reviewed: 02/05/2017 Elsevier Patient Education  2020 [employer]  

## 2019-10-13 NOTE — Progress Notes (Signed)
Subjective:   Patient ID: Melanie Khan, female   DOB: 39 y.o.   MRN: [id number]   HPI 39 year old female presents the office today for concerns of a left fifth toe fracture that she injured about 8 weeks ago when she crusted onto water. She states that doing somewhat better but she still has some discomfort swelling. She states that 98% of time she does not have discomfort but if she puts pressure on it the wrong way or rubs it causes discomfort. He does not suffer from daily activities.    Review of Systems  All other systems reviewed and are negative.  Past Medical History:  Diagnosis Date   Anxiety    Depression    Elevated prolactin level (HCC)    HSV-2 infection    OCD (obsessive compulsive disorder)     Past Surgical History:  Procedure Laterality Date   CESAREAN SECTION N/A 03/11/2018   Procedure: CESAREAN SECTION;  Surgeon: Myna Hidalgo, DO;  Location: [facility];  Service: Obstetrics;  Laterality: N/A;   right arm fracture  2012   WISDOM TOOTH EXTRACTION       Current Outpatient Medications:    Prenatal MV & Min w/FA-DHA (PRENATAL ADULT GUMMY/DHA/FA PO), Take 1 capsule by mouth daily., Disp: , Rfl:    valACYclovir (VALTREX) 500 MG tablet, as needed., Disp: , Rfl:   No Known Allergies       Objective:  Physical Exam  General: AAO x3, NAD  Dermatological: Skin is warm, dry and supple bilateral.There are no open sores, no preulcerative lesions, no rash or signs of infection present.  Vascular: Dorsalis Pedis artery and Posterior Tibial artery pedal pulses are 2/4 bilateral with immedate capillary fill time.  There is no pain with calf compression, swelling, warmth, erythema.   Neruologic: Grossly intact via light touch bilateral.   Musculoskeletal: Adductovarus is present to bilateral fifth digits. The left side there is no swelling distal aspect of the toe. There is no area of skin breakdown. There is no significant tenderness on exam today. No  pain in the metatarsals or other areas of the toes. Muscular strength 5/5 in all groups tested bilateral.  Gait: Unassisted, Nonantalgic.       Assessment:   Healing fracture left fifth toe    Plan:  -Treatment options discussed including all alternatives, risks, and complications -Etiology of symptoms were discussed -X-rays were obtained and reviewed with the patient. Radiolucency transversely distal phalanx consistent with a healing fracture. -I dispensed offloading pads for the toes. Discussed wearing stiffer soled shoe. Symptoms are improving although slowly. Hopefully next 4 to 6 weeks continue to improve but if not let me know.  Vivi Barrack DPM

## 2020-01-21 ENCOUNTER — Encounter (HOSPITAL_COMMUNITY): Payer: Self-pay

## 2020-01-21 ENCOUNTER — Other Ambulatory Visit: Payer: Self-pay

## 2020-01-21 ENCOUNTER — Emergency Department (HOSPITAL_COMMUNITY)
Admission: EM | Admit: 2020-01-21 | Discharge: 2020-01-21 | Disposition: A | Discharge status: Home / Self Care | Payer: BC Managed Care – PPO | Attending: Emergency Medicine | Admitting: Emergency Medicine

## 2020-01-21 DIAGNOSIS — M549 Dorsalgia, unspecified: Secondary | ICD-10-CM | POA: Insufficient documentation

## 2020-01-21 DIAGNOSIS — Z5321 Procedure and treatment not carried out due to patient leaving prior to being seen by health care provider: Secondary | ICD-10-CM | POA: Insufficient documentation

## 2020-01-21 NOTE — ED Triage Notes (Signed)
Patient arrived with complaints of upper back pain that woke her up tonight. Patient verbalized family history of cardiac problems and states she is concerned due to it not feeling muscular. Declines any chest pain, NV, or dizziness.

## 2020-09-09 ENCOUNTER — Other Ambulatory Visit: Payer: Self-pay

## 2020-09-09 ENCOUNTER — Other Ambulatory Visit: Payer: Self-pay | Admitting: Internal Medicine

## 2020-09-09 ENCOUNTER — Ambulatory Visit
Admission: RE | Admit: 2020-09-09 | Discharge: 2020-09-09 | Disposition: A | Discharge status: Home / Self Care | Payer: BC Managed Care – PPO | Source: Ambulatory Visit | Attending: Internal Medicine | Admitting: Internal Medicine

## 2020-09-09 DIAGNOSIS — R052 Subacute cough: Secondary | ICD-10-CM

## 2021-11-23 ENCOUNTER — Other Ambulatory Visit: Payer: Self-pay | Admitting: Internal Medicine

## 2021-11-23 DIAGNOSIS — J329 Chronic sinusitis, unspecified: Secondary | ICD-10-CM

## 2021-11-27 ENCOUNTER — Ambulatory Visit
Admission: RE | Admit: 2021-11-27 | Discharge: 2021-11-27 | Disposition: A | Discharge status: Home / Self Care | Payer: BC Managed Care – PPO | Source: Ambulatory Visit | Attending: Internal Medicine | Admitting: Internal Medicine

## 2021-11-27 DIAGNOSIS — J329 Chronic sinusitis, unspecified: Secondary | ICD-10-CM

## 2023-08-28 IMAGING — CR DG CHEST 2V
2 series · 2 of 2 positions shown · non-contrast
Comparison: 05/28/2010

CLINICAL DATA: Cough and congestion

EXAM:
CHEST - 2 VIEW

[w chest pa]
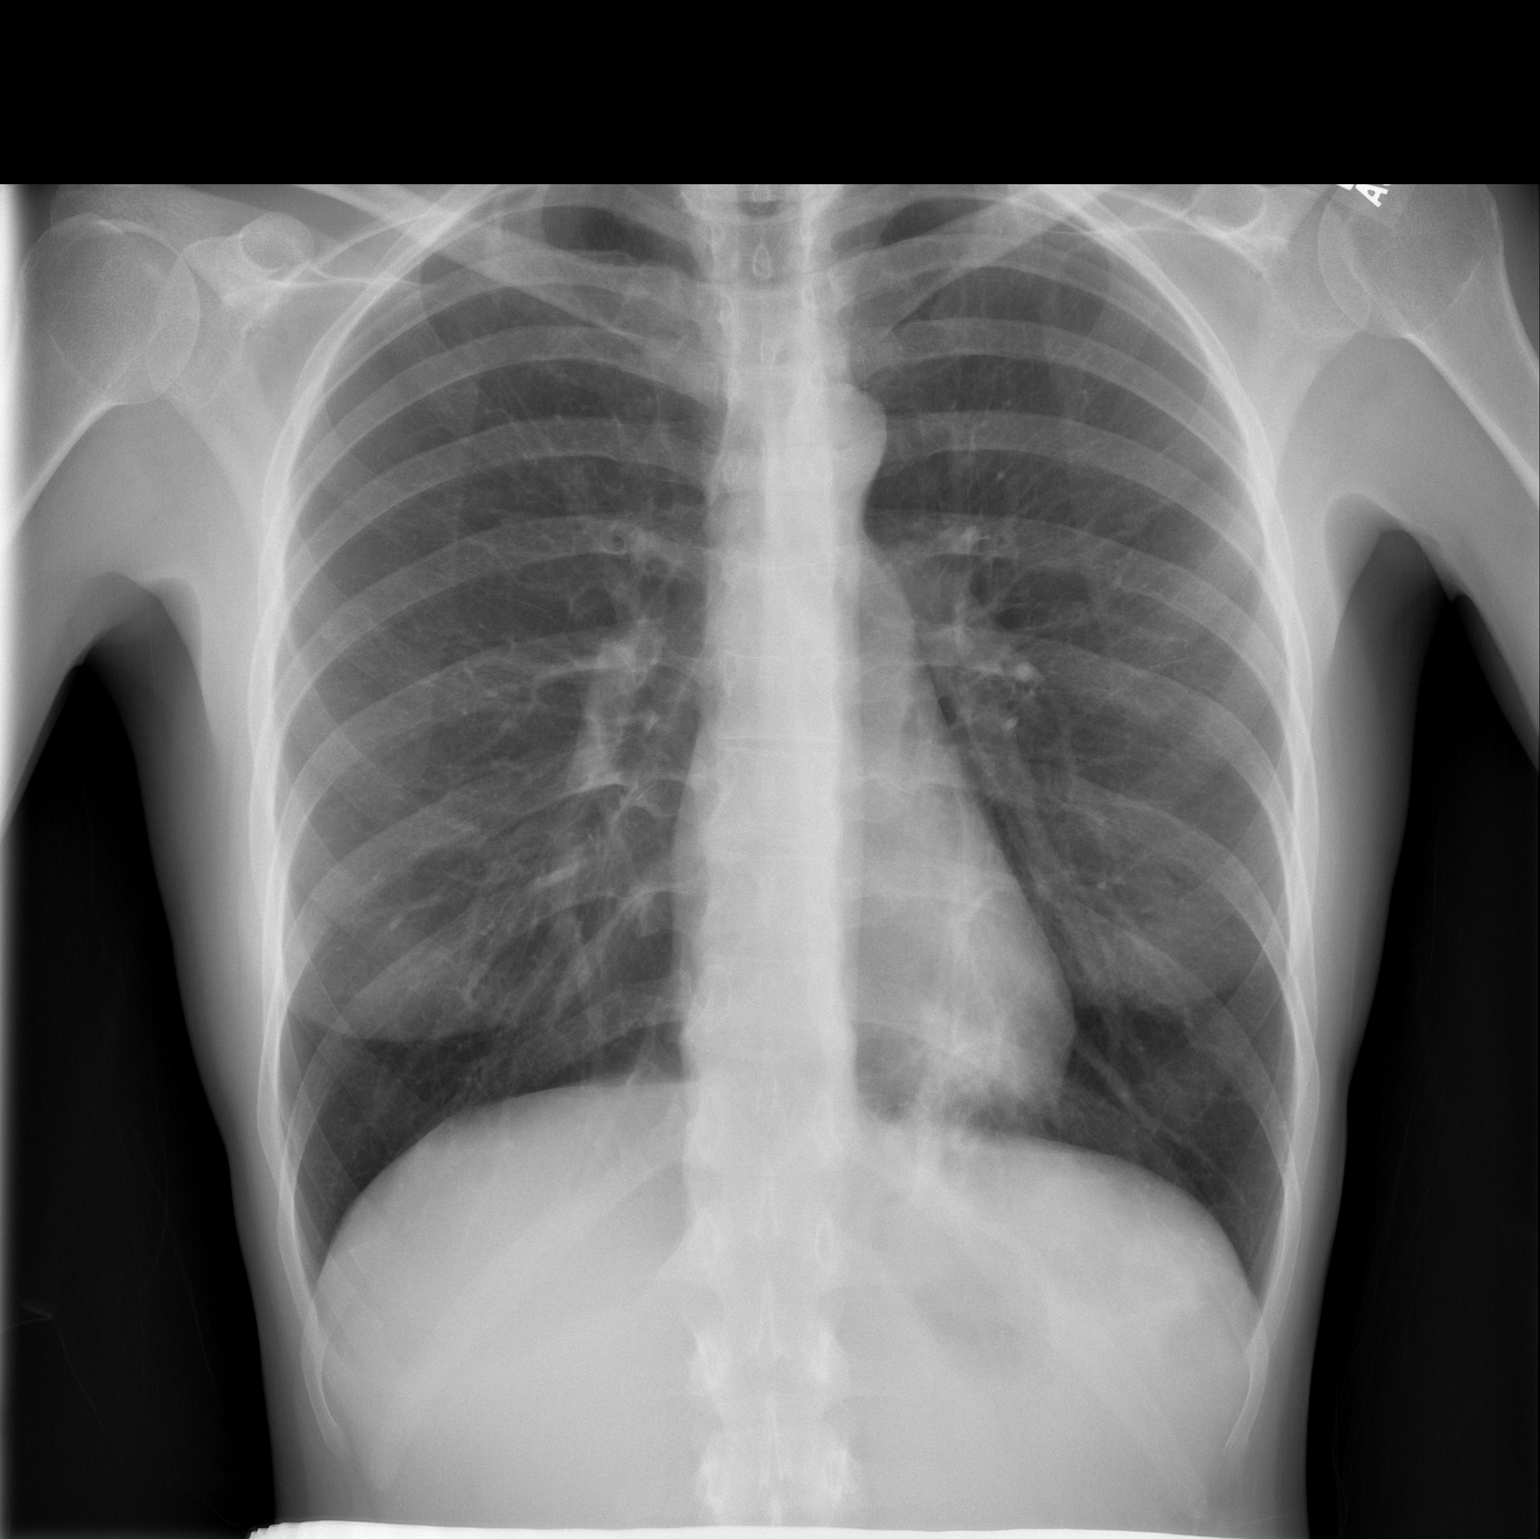

[w chest lat]
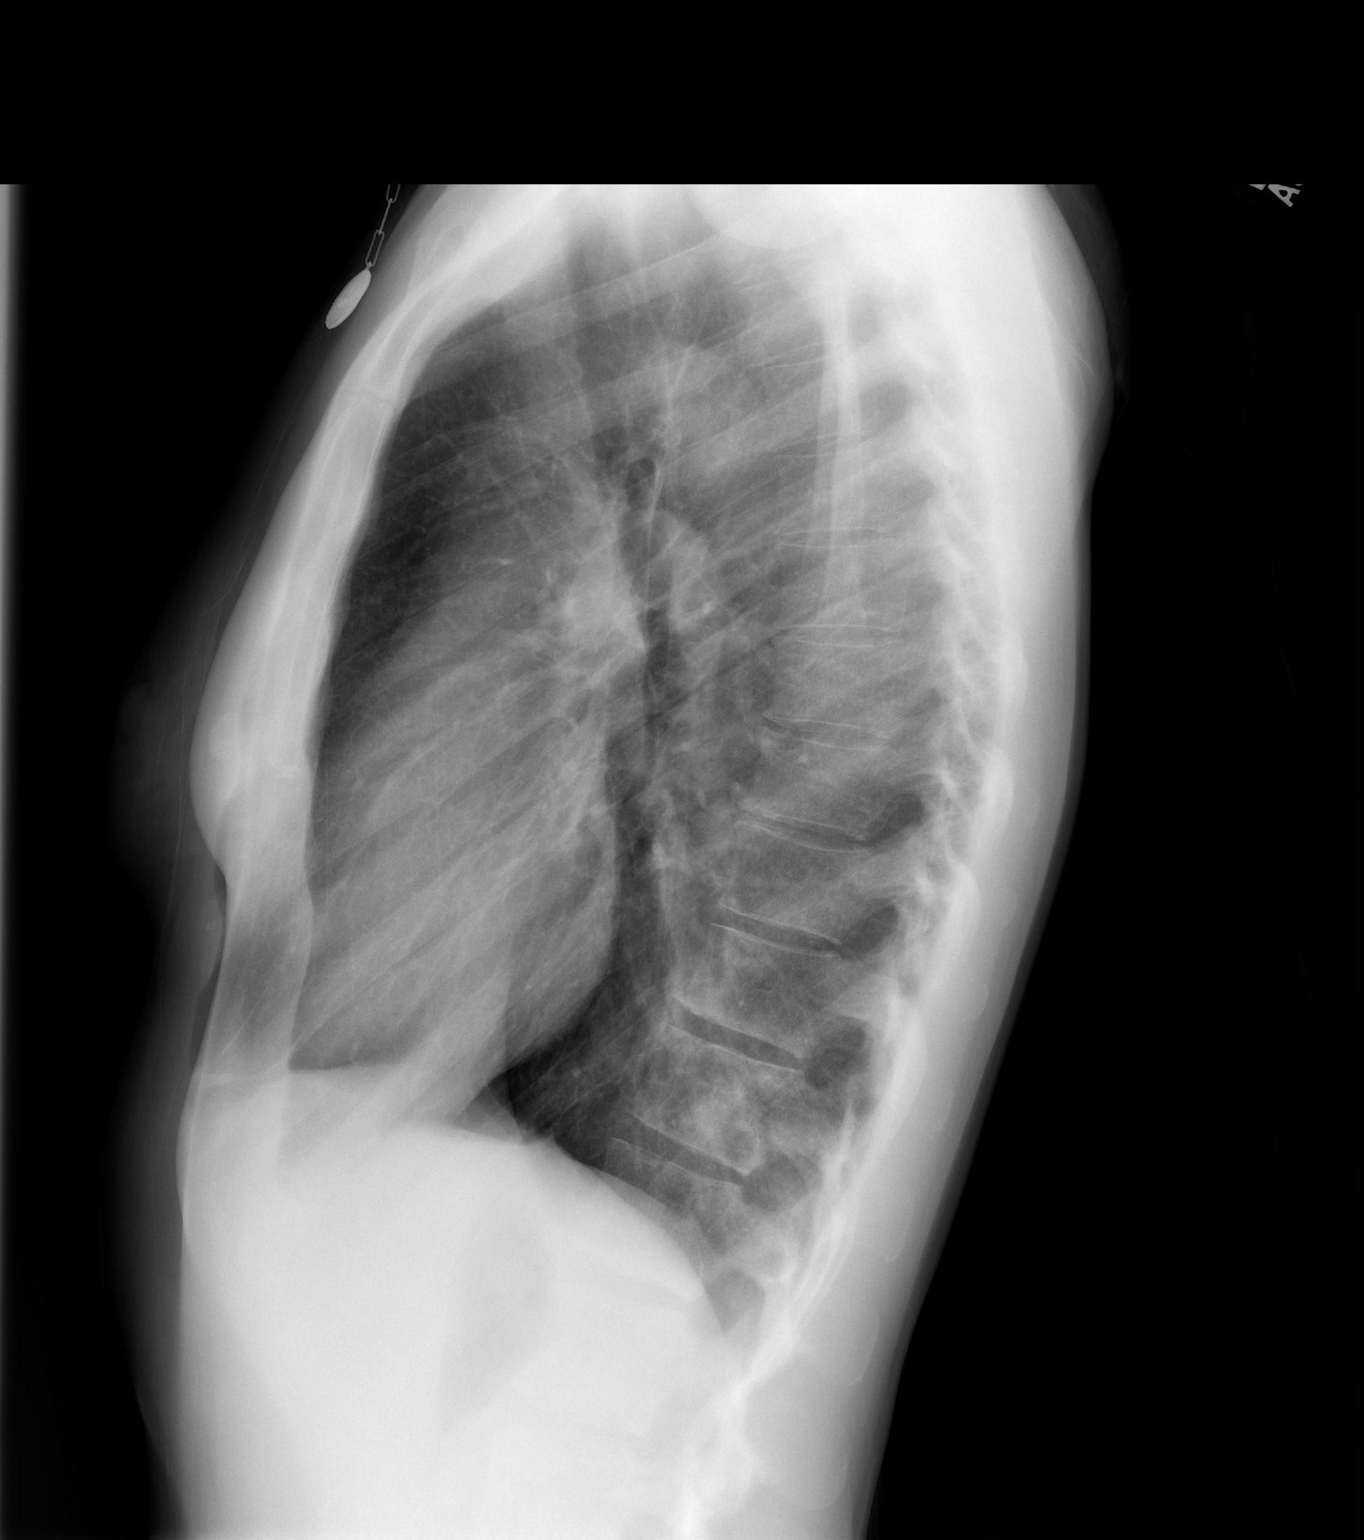

[2 of 2 positions shown; findings below may reference images not displayed]

FINDINGS: The heart size and mediastinal contours are within normal limits.
Both lungs are clear. The visualized skeletal structures are
unremarkable.
IMPRESSION: No acute abnormality of the lungs.
